# Patient Record
Sex: Female | Born: 1970 | Race: White | Hispanic: No | Marital: Married | Smoking: Never smoker
Health system: Southern US, Community
[De-identification: ages and names within clinical notes are randomized; demographics above are authoritative.]

## PROBLEM LIST (undated history)

## (undated) DIAGNOSIS — F419 Anxiety disorder, unspecified: Secondary | ICD-10-CM

## (undated) DIAGNOSIS — Z862 Personal history of diseases of the blood and blood-forming organs and certain disorders involving the immune mechanism: Secondary | ICD-10-CM

## (undated) DIAGNOSIS — G43909 Migraine, unspecified, not intractable, without status migrainosus: Secondary | ICD-10-CM

## (undated) DIAGNOSIS — F338 Other recurrent depressive disorders: Secondary | ICD-10-CM

## (undated) DIAGNOSIS — R760 Raised antibody titer: Secondary | ICD-10-CM

## (undated) DIAGNOSIS — D649 Anemia, unspecified: Secondary | ICD-10-CM

## (undated) HISTORY — DX: Raised antibody titer: R76.0

## (undated) HISTORY — PX: WISDOM TOOTH EXTRACTION: SHX21

## (undated) HISTORY — DX: Other recurrent depressive disorders: F33.8

## (undated) HISTORY — DX: Personal history of diseases of the blood and blood-forming organs and certain disorders involving the immune mechanism: Z86.2

## (undated) HISTORY — PX: TONSILLECTOMY: SUR1361

## (undated) HISTORY — DX: Anxiety disorder, unspecified: F41.9

## (undated) HISTORY — DX: Anemia, unspecified: D64.9

## (undated) HISTORY — DX: Migraine, unspecified, not intractable, without status migrainosus: G43.909

---

## 1998-08-10 ENCOUNTER — Other Ambulatory Visit: Admission: RE | Admit: 1998-08-10 | Discharge: 1998-08-10 | Payer: Self-pay | Admitting: Obstetrics and Gynecology

## 1999-01-16 ENCOUNTER — Other Ambulatory Visit: Admission: RE | Admit: 1999-01-16 | Discharge: 1999-01-16 | Payer: Self-pay | Admitting: Obstetrics and Gynecology

## 1999-11-20 ENCOUNTER — Encounter (INDEPENDENT_AMBULATORY_CARE_PROVIDER_SITE_OTHER): Payer: Self-pay

## 1999-11-20 ENCOUNTER — Inpatient Hospital Stay (HOSPITAL_COMMUNITY): Admission: AD | Admit: 1999-11-20 | Discharge: 1999-11-23 | Payer: Self-pay | Admitting: Obstetrics and Gynecology

## 1999-11-24 ENCOUNTER — Encounter: Admission: RE | Admit: 1999-11-24 | Discharge: 1999-12-25 | Payer: Self-pay | Admitting: Obstetrics and Gynecology

## 1999-12-24 ENCOUNTER — Other Ambulatory Visit: Admission: RE | Admit: 1999-12-24 | Discharge: 1999-12-24 | Payer: Self-pay | Admitting: Obstetrics and Gynecology

## 2001-01-08 ENCOUNTER — Other Ambulatory Visit: Admission: RE | Admit: 2001-01-08 | Discharge: 2001-01-08 | Payer: Self-pay | Admitting: Obstetrics and Gynecology

## 2002-03-01 ENCOUNTER — Other Ambulatory Visit: Admission: RE | Admit: 2002-03-01 | Discharge: 2002-03-01 | Payer: Self-pay | Admitting: Obstetrics and Gynecology

## 2003-03-23 ENCOUNTER — Other Ambulatory Visit: Admission: RE | Admit: 2003-03-23 | Discharge: 2003-03-23 | Payer: Self-pay | Admitting: Obstetrics and Gynecology

## 2003-04-04 ENCOUNTER — Encounter: Payer: Self-pay | Admitting: Obstetrics and Gynecology

## 2003-04-04 ENCOUNTER — Encounter: Admission: RE | Admit: 2003-04-04 | Discharge: 2003-04-04 | Payer: Self-pay | Admitting: Obstetrics and Gynecology

## 2003-04-22 ENCOUNTER — Emergency Department (HOSPITAL_COMMUNITY): Admission: EM | Admit: 2003-04-22 | Discharge: 2003-04-22 | Payer: Self-pay

## 2003-11-25 ENCOUNTER — Emergency Department (HOSPITAL_COMMUNITY): Admission: EM | Admit: 2003-11-25 | Discharge: 2003-11-25 | Payer: Self-pay | Admitting: Emergency Medicine

## 2003-12-22 ENCOUNTER — Other Ambulatory Visit: Admission: RE | Admit: 2003-12-22 | Discharge: 2003-12-22 | Payer: Self-pay | Admitting: Obstetrics and Gynecology

## 2004-06-15 ENCOUNTER — Ambulatory Visit (HOSPITAL_COMMUNITY): Admission: RE | Admit: 2004-06-15 | Discharge: 2004-06-15 | Payer: Self-pay | Admitting: Obstetrics and Gynecology

## 2004-06-27 ENCOUNTER — Inpatient Hospital Stay (HOSPITAL_COMMUNITY): Admission: RE | Admit: 2004-06-27 | Discharge: 2004-06-30 | Payer: Self-pay | Admitting: Obstetrics and Gynecology

## 2004-06-27 ENCOUNTER — Encounter (INDEPENDENT_AMBULATORY_CARE_PROVIDER_SITE_OTHER): Payer: Self-pay | Admitting: Specialist

## 2004-07-02 ENCOUNTER — Encounter: Admission: RE | Admit: 2004-07-02 | Discharge: 2004-08-01 | Payer: Self-pay | Admitting: Obstetrics and Gynecology

## 2004-08-07 ENCOUNTER — Other Ambulatory Visit: Admission: RE | Admit: 2004-08-07 | Discharge: 2004-08-07 | Payer: Self-pay | Admitting: Obstetrics and Gynecology

## 2009-04-04 ENCOUNTER — Other Ambulatory Visit: Admission: RE | Admit: 2009-04-04 | Discharge: 2009-04-04 | Payer: Self-pay | Admitting: Obstetrics and Gynecology

## 2010-09-30 ENCOUNTER — Encounter: Payer: Self-pay | Admitting: Neurology

## 2011-01-25 NOTE — Op Note (Signed)
NAME:  Karen Lozano, Karen Lozano                   ACCOUNT NO.:  [id number]   MEDICAL RECORD NO.:  [id number]          PATIENT TYPE:  INP   LOCATION:  9198                          FACILITY:  [facility]   PHYSICIAN:  Juluis Mire, M.D.   DATE OF BIRTH:  19-Feb-1971   DATE OF PROCEDURE:  06/27/2004  DATE OF DISCHARGE:                                 OPERATIVE REPORT   PREOPERATIVE DIAGNOSIS:  1.  Intrauterine pregnancy at 38 weeks with prior cesarean section; desires      repeat.  2.  Prior pregnancy was complicated by the baby having an intrauterine      cerebrovascular accident.   POSTOPERATIVE DIAGNOSIS:  1.  Intrauterine pregnancy at 38 weeks with prior cesarean section; desires      repeat.  2.  Prior pregnancy was complicated by the baby having an intrauterine      cerebrovascular accident.   PROCEDURE:  Low transverse cesarean section.   SURGEON:  Juluis Mire, M.D.   ANESTHESIA:  Spinal.   ESTIMATED BLOOD LOSS:  500 mL.   PACKS AND DRAINS:  None.   INTRAOPERATIVE BLOOD REPLACED:  None.   COMPLICATIONS:  None.   INDICATIONS:  As dictated in the history and physical.   DESCRIPTION OF PROCEDURE:  The patient was taken to the operating room and  placed in the supine position with a left lateral tilt.  After a  satisfactory level of spinal anesthesia, the abdomen was prepped out with  Betadine and draped in a sterile field.  A lower transverse skin incision  was identified and excised.  Incision was extended through the subcutaneous  tissue.  The fascia was entered sharply, incision to the fascia was extended  laterally.  The fascia was taken off the muscle superiorly and inferiorly.  The rectus muscles were separated in the midline.  The peritoneum was  entered sharply and incision in the peritoneum extended both superiorly and  inferiorly.  A low transverse bladder flap was developed.  A low transverse  uterine incision was begun with the knife and extended laterally using  manual  traction.  Amniotic fluid was clear.  The infant was presenting in  the vertex presentation and delivered with elevation of the head and fundal  pressure.  The infant was a viable female, weighing 5 pounds 14 ounces.  Apgars were 9/9.  The pH is pending.  The placenta was delivered manually  and sent for pathology.  The uterus was exteriorized.  The uterus was closed  in a running locking stitch using 0 chromic using two layer closure  technique.  We had excellent hemostasis.  Tubes and ovaries were  unremarkable.  Urine output was clear and adequate.  The uterus was returned  to the abdominal cavity.  We thoroughly irrigated it.  Hemostasis was  excellent.  Muscles were reapproximated with running suture of 3-0 Vicryl.  The fascia was closed was running sutures of 0 PDS.  The skin was closed  with staples and Steri-Strips.  Sponge, instrument and needle counts were  reported as  correct by the  circulating nurse x2.  Foley catheter remained clear at the  time of closure.  The patient tolerated the procedure well and was returned  to the recovery room in good condition.   DESCRIPTION OF PROCEDURE:  @@      JSM/MEDQ  D:  06/27/2004  T:  06/27/2004  Job:  [id number]

## 2011-01-25 NOTE — Discharge Summary (Signed)
[facility]  Patient:    Karen Lozano, Karen Lozano                            MRN: [id number] Adm. Date:  [id number] Disc. Date: [id number] Attending:  Donne Hazel Dictator:   Danie Chandler, R.N.                           Discharge Summary  ADMISSION DIAGNOSIS:          Intrauterine pregnancy at 36-4/[redacted] weeks gestation.  Admitted for induction of labor for fetal growth restriction and grade 3 placenta at term.  DISCHARGE DIAGNOSES:          1. Intrauterine pregnancy at 36-4/[redacted] weeks gestation.                                  Admitted for induction of labor for fetal growth                                  restriction and grade 3 placenta at term.                               2. Fetal intolerance to labor.                               3. Anemia.  PROCEDURES:                   On November 20, 1999, primary low transverse cesarean section.  HISTORY OF PRESENT ILLNESS:   The patient is a 40 year old, gravida 2, para 0, t 36-4/[redacted] weeks gestation, who was admitted for induction for an estimated fetal weight of 5% and grade 3 placenta.  The patient has an estimated date of confinement of November 29, 1999.  The patient has been monitored for lagging fetal growth.  The patient was admitted.  Her PIH labs were checked, which were within normal limits.  The patient did receive Cytotec and Pitocin for induction of labor and she did progress throughout the day.  That evening, there were repetitive late deceleration in the fetal heart rate tracing.  There was one acceleration with scalp stimulation, but then variables followed by slow return.  Other variables  with slow returns also.  The cervix remained tight at 4 cm, 90%, vertex, and -1. It was elected to proceed with primary cesarean section for fetal intolerance to labor.  HOSPITAL COURSE:              The patient is taken to the operating room and undergoes the above-named procedure without complications.   This is productive f a viable female infant with Apgars of 8 at one minute and 9 at five minutes and an arterial cord pH of 7.29.  Postoperatively the patient does well.  On postoperative day #1, the patient had good control of pain.  Her hemoglobin was 8.4, hematocrit 24.9, and white blood cell count 13.9.  She was started on iron daily and a repeat CBC ordered for November 22, 1999.  On postoperative day #2, the patients hemoglobin was stable at 8.2.  She was continued on iron daily.  She had a good return of bowel function and was tolerating a regular diet.  She was also ambulating well without difficulty.  She was discharged home on postoperative day #3.  CONDITION ON DISCHARGE:       Good.  DIET:                         Regular as tolerated.  ACTIVITY:                     No heavy lifting.  No driving.  No vaginal entry.  FOLLOW-UP:                    She is to follow up in the office in one to two weeks for incision check.  She is to call for temperature greater than 100 degrees, persistent nausea or vomiting, heavy vaginal bleeding, and/or redness or drainage from the incision site.  DISCHARGE MEDICATIONS:        1. Prenatal vitamins one p.o. q.d.                               2. Nu-Iron 150 mg one p.o. q.d.                               3. Tylox one to two every four hours as needed for                                  pain.                               4. Ibuprofen 600 mg p.o. q.6h. p.r.n. pain. DD:  11/23/99 TD:  11/25/99 Job: [id number]

## 2011-01-25 NOTE — Discharge Summary (Signed)
NAME:  Karen Lozano, Karen Lozano                   ACCOUNT NO.:  [id number]   MEDICAL RECORD NO.:  [id number]          PATIENT TYPE:  INP   LOCATION:  9114                          FACILITY:  [facility]   PHYSICIAN:  Freddy Finner, M.D.   DATE OF BIRTH:  March 11, 1971   DATE OF ADMISSION:  06/27/2004  DATE OF DISCHARGE:  06/30/2004                                 DISCHARGE SUMMARY   ADMISSION DIAGNOSES:  1.  Intrauterine pregnancy at 60 weeks estimated gestational age.  2.  Prior cesarean delivery, desires repeat.   DISCHARGE DIAGNOSES:  1.  Status post low transverse cesarean section.  2.  Viable female infant.   PROCEDURE:  Repeat low transverse cesarean section.   REASON FOR ADMISSION:  Please see dictated H&P.   HOSPITAL COURSE:  The patient is a 40 year old, gravida 3, para 1 that was  admitted to [facility] for a scheduled cesarean section.  The patient had had a previous cesarean delivery and desired repeat.  Pregnancy had been complicated by a prior pregnancy with intrauterine growth  retardation and infant with an intrauterine stroke.  On the morning of  admission, the patient was taken to the operating room where spinal  anesthesia was administered without difficulty.  A low transverse incision  was made with the delivery of a viable female infant weighing 5 pounds 14  ounces with Apgar's of 9 at 1 minute and 9 at 5 minutes. Arterial cord pH  was 7.35.  The patient tolerated the procedure well and was taken to the  recovery room in stable condition.   On postoperative day one, the patient was without complaints, vital signs  were stable, she was afebrile, abdomen was soft with good return of bowel  function, fundus was firm and nontender, abdominal dressing was noted to  have a small amount of drainage noted on bandage. The patient was ambulating  well.  Labs revealed a hemoglobin of 8.5, platelet count of 158,000, WBC  count of 11.2.  The baby was doing well, however, the  baby was noted to have  some tachypnea but remained in the central nursery. On postoperative day  two, the patient did complain of some neck stiffness, she denied a headache  or blurred vision. Vital signs were stable, she was afebrile, abdomen was  soft, fundus was firm and nontender.  Abdominal dressing had been removed  revealing an incision that was clean, dry and intact.  On postoperative day  three, the patient was without any major complaints other than some  incisional soreness, vital signs remained stable. She was afebrile, fundus  was firm and nontender. Incision was clean, dry and intact, staples removed  and patient was discharged home.   CONDITION ON DISCHARGE:  Good.   DIET:  Regular as tolerated.   ACTIVITY:  No heavy lifting, no driving x2 weeks, no vaginal entry.   FOLLOW UP:  The patient is to followup in the office in 7-10 days for an  incision check. She is to call for a temperature greater than 100 degrees,  persistent nausea and vomiting,  heavy vaginal bleeding and/or redness or  drainage from the incisional site.   DISCHARGE MEDICATIONS:  1.  Tylox #30, 1 p.o. q.4-6 h. p.r.n.  2.  Motrin 600 mg q.6 h. p.r.n.  3.  Prenatal vitamins 1 p.o. daily.  4.  Ferrous sulfate 325 mg 1 p.o. b.i.d. with meals.     Caro   CC/MEDQ  D:  07/16/2004  T:  07/16/2004  Job:  [id number]

## 2011-01-25 NOTE — H&P (Signed)
NAME:  Karen Lozano, FRALEY NO.:  [id number]   MEDICAL RECORD NO.:  [id number]          PATIENT TYPE:  INP   LOCATION:  NA                            FACILITY:  [facility]   PHYSICIAN:  Juluis Mire, M.D.   DATE OF BIRTH:  [birth year]   DATE OF ADMISSION:  06/27/2004  DATE OF DISCHARGE:                                HISTORY & PHYSICAL   HISTORY OF PRESENT ILLNESS:  The patient is a 40 year old gravida 3, para 1,  abortus 1, married white female, last menstrual period of January 26, giving  her an estimated date of confinement of November 3, and an estimated  gestational age of [redacted] weeks. This is consistent with initial exam and  ultrasound. She presents for repeat cesarean section.   Her prior pregnancy was complicated by intrauterine growth retardation. She  did undergo a cesarean section. The infant did suffer an intrauterine  stroke. She had a complete workup for thrombophilia with negative findings.  We have repeated her lupus anticoagulant, anticardiolipin and antinuclear  antibody which were all negative early in pregnancy. She has been maintained  on baby aspirin. She has been followed with serial ultrasounds and nonstress  testing. There has not been any evidence of growth retardation or decrease  in amniotic fluid, was seen to have normal placental function. She did have  an amniocentesis done late week with an LS of 1.8:1 with no PG. We have  waited a week and now will proceed with repeat cesarean section. Her glucose  test was normal and she is negative I believe for group B strep.   ALLERGIES:  No known drug allergies.   MEDICATIONS:  Prenatal vitamins and baby aspirin.   PAST MEDICAL HISTORY, FAMILY HISTORY AND SOCIAL HISTORY:  Please see  prenatal records.   REVIEW OF SYSTEMS:  Noncontributory.   PHYSICAL EXAMINATION:  VITAL SIGNS:  The patient is afebrile with stable  vital signs.  HEENT EXAM:  The patient normocephalic. Pupils equal, round and reactive  to  light and accommodation. Extraocular movements were intact. Sclerae and  conjunctivae clear. Oropharynx clear.  NECK:  Without thyromegaly.  BREASTS:  No discrete masses.  LUNGS:  Clear.  CARDIAC SYSTEM:  Regular rhythm and rate with a grade 2/6 systolic ejection  murmur, no clicks or gallops.  ABDOMINAL EXAM:  Gravid uterus consistent with dates.  PELVIC EXAM:  Deferred.  EXTREMITIES:  Trace edema.  NEUROLOGICAL EXAM:  Grossly within normal limits. Deep tendon reflexes 2+.  No clonus.   IMPRESSION:  1.  Intrauterine pregnancy at 38 weeks with prior cesarean section, desires      repeat.  2.  Prior pregnancy complicated by intrauterine growth retardation, infant      with intrauterine stroke.   PLAN:  The patient will undergo repeat cesarean section. The options of a  trial of labor have been discussed and declined by the patient. Risks of  procedure discussed including the risk of infection; the risk of hemorrhage  that  could require transfusion, the risk of HIV or hepatitis; the risk of injury  to adjacent organs including bladder, bowel or ureters that could require  further exploratory surgery; the risk of deep venous thrombosis and  pulmonary embolus. The patient voiced understanding of the indications and  risks.      JSM/MEDQ  D:  06/27/2004  T:  06/27/2004  Job:  [id number]

## 2011-01-25 NOTE — Op Note (Signed)
[facility]  Patient:    Karen Lozano, Karen Lozano                            MRN: [id number] Proc. Date: 11/20/99 Adm. Date:  [id number] Attending:  Donne Hazel                           Operative Report  PREOPERATIVE DIAGNOSIS:       Fetal intolerance to labor.  POSTOPERATIVE DIAGNOSIS:      Fetal intolerance to labor.  OPERATION:                    Primary low transverse cesarean section.  SURGEON:                      Beather Arbour. Thomasena Edis, M.D.  ASSISTANT:  ANESTHESIA:                   Epidural.  ESTIMATED BLOOD LOSS:         750 cc.  URINE OUTPUT:                 250 cc.  FLUIDS:                       1600 cc of Crystalloid and 1 gram of Ancef IV.  DRAINS:                       Foley.  COMPLICATIONS:                None.  DESCRIPTION OF PROCEDURE:     The patient was brought to the operating room and  identified on the operating table.  After topping off the patients epidural, she was placed in the left uterine displacement position and prepped and draped in he usual sterile fashion.  A Foley catheter had been previously placed.  A Pfannenstiel incision was made and carried down to the fascia.  The fascia was scored in the midline and extended bilaterally using Mayo scissors.  It was then separated free from the underlying muscles.  The muscles were separated in the midline down to the symphysis.  The peritoneum was entered sharply and carefully taking care to avoid bowel or other abdominal contents.  The peritoneal incision was extended superiorly and then inferiorly down to the bladder edge.  The bladder blade was placed and the bladder flap was developed.  The uterus was scored in he lower uterine segment and the incision was extended in a curvilinear fashion using bandage scissors.  There was noted to be clear fluid.  The baby was noted to be  well placed into the pelvis and at the last two cervical checks was noted to  be  extremely molded.  The vertex was delivered without difficulty onto the operative field.  The infant was bulb suctioned and the remainder of the infant was delivered without difficulty onto the operative field.  The cord was doubly clamped and cut and the baby was handed to the awaiting neonatal resuscitation team.  The cord H was sent and found to be 7.29.  Cord bloods were collected.  The placenta was manually removed and sent to pathology for examination due to the history of grade III placenta and intrauterine growth restriction.  The uterus was wiped clean of products of conception and  exteriorized onto the abdomen.  It was wrapped in a et lap pack.  Ring clamps were placed on the uterine incision.  There was noted to be a small vertical incision approximately 2 cm which was repaired using a simple running interlocking suture of 0 Monocryl.  The remainder of the uterine incision was repaired using a simple running interlocking suture of 0 Monocryl.  The figure-of-eight sutures of 0 Monocryl were placed at bleeding edges along the uterine incision.  After noting excellent uterine incision hemostasis and excellent hemostasis at the extension, the uterus was placed back into the abdominal cavity after noting the uterus, tubes, and ovaries to be grossly normal.  The incision was irrigated copiously with warm Ringers lactate and the gutters were irrigated copiously with warm Ringers lactate as well.  Again the uterine incision was examined for evidence of hemostasis and excellent hemostasis was noted.  There as noted to be excellent hemostasis at the bladder flap.  Kelly clamps were placed on the peritoneum and the peritoneum was closed using simple running suture of 2-0  Monocryl.  There was noted to be a torsed appendices epiploica which was clamped, cut, sent to pathology for examination, and the area was tied with a free tie.  There was noted to be excellent  hemostasis.  After closure of the peritoneum using a simple running suture of 2-0 Monocryl, the muscles were tacked back together n the midline with one suture of 2-0 Monocryl.  Excellent hemostasis was achieved  over the subfascial areas and muscle using cautery.  The subfascial areas were irrigated copiously with warm Ringers lactate and again excellent hemostasis was assured.  The fascia was closed using a simple running suture of 0 PDS with the  suture anchored at the leftmost angle of the incision, run to the rightmost angle of the incision, and tied.  The subcutaneous tissue was irrigated copiously with warm Ringers lactate, and excellent hemostasis was noted.  The skin was closed with staples and Steri-Strips were applied.  The patient tolerated the procedure well without apparent complications and was transferred to the recovery room in  stable condition after all sponge, needle, and instrument counts were correct. The baby was found to be a 5 pound 5 ounce female with Apgars of 8 and 9.  Cord pH 7.29 and went to the normal newborn nursery. DD:  11/20/99 TD:  11/21/99 Job: [id number]

## 2012-03-23 ENCOUNTER — Other Ambulatory Visit: Payer: Self-pay | Admitting: Family Medicine

## 2012-03-23 DIAGNOSIS — Z1231 Encounter for screening mammogram for malignant neoplasm of breast: Secondary | ICD-10-CM

## 2012-04-08 ENCOUNTER — Ambulatory Visit: Payer: Self-pay

## 2013-11-16 ENCOUNTER — Other Ambulatory Visit: Payer: Self-pay

## 2013-11-16 DIAGNOSIS — Z1231 Encounter for screening mammogram for malignant neoplasm of breast: Secondary | ICD-10-CM

## 2013-11-19 ENCOUNTER — Ambulatory Visit
Admission: RE | Admit: 2013-11-19 | Discharge: 2013-11-19 | Disposition: A | Discharge status: Home / Self Care | Payer: 59 | Source: Ambulatory Visit

## 2013-11-19 DIAGNOSIS — Z1231 Encounter for screening mammogram for malignant neoplasm of breast: Secondary | ICD-10-CM

## 2015-03-16 ENCOUNTER — Ambulatory Visit (INDEPENDENT_AMBULATORY_CARE_PROVIDER_SITE_OTHER): Payer: 59 | Admitting: Obstetrics & Gynecology

## 2015-03-16 ENCOUNTER — Other Ambulatory Visit: Payer: Self-pay | Admitting: Obstetrics & Gynecology

## 2015-03-16 ENCOUNTER — Encounter: Payer: Self-pay | Admitting: Obstetrics & Gynecology

## 2015-03-16 ENCOUNTER — Ambulatory Visit
Admission: RE | Admit: 2015-03-16 | Discharge: 2015-03-16 | Disposition: A | Discharge status: Home / Self Care | Payer: 59 | Source: Ambulatory Visit | Attending: Obstetrics & Gynecology | Admitting: Obstetrics & Gynecology

## 2015-03-16 VITALS — BP 124/60 | HR 64 | Resp 16 | Ht 63.0 in | Wt 114.8 lb

## 2015-03-16 DIAGNOSIS — Z1231 Encounter for screening mammogram for malignant neoplasm of breast: Secondary | ICD-10-CM

## 2015-03-16 DIAGNOSIS — N943 Premenstrual tension syndrome: Secondary | ICD-10-CM

## 2015-03-16 DIAGNOSIS — Z01419 Encounter for gynecological examination (general) (routine) without abnormal findings: Secondary | ICD-10-CM | POA: Diagnosis not present

## 2015-03-16 DIAGNOSIS — Z124 Encounter for screening for malignant neoplasm of cervix: Secondary | ICD-10-CM | POA: Diagnosis not present

## 2015-03-16 DIAGNOSIS — Z0000 Encounter for general adult medical examination without abnormal findings: Secondary | ICD-10-CM | POA: Diagnosis not present

## 2015-03-16 DIAGNOSIS — R760 Raised antibody titer: Secondary | ICD-10-CM

## 2015-03-16 NOTE — Progress Notes (Signed)
44 y.o. [id number] MarriedCaucasianF here for annual exam.    Has tried Effexor and Welbutrin.  She felt this really "killed" her libido.  She has been given Ativan but can't take it due to being on call.    Patient's last menstrual period was 02/27/2015.          Sexually active: Yes.    The current method of family planning is vasectomy.    Exercising: Yes.    walking, infrequent weight training Smoker:  no  Health Maintenance: Pap:  2013 History of abnormal Pap:  no MMG:  11/19/13-normal Colonoscopy:  none BMD:   Yes-not sure when TDaP:  UTD Screening Labs: PCP, Hb today: PCP, Urine today: PCP   reports that she has never smoked. She has never used smokeless tobacco. She reports that she drinks alcohol. She reports that she does not use illicit drugs.  Past Medical History  Diagnosis Date  . 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 Migraine     had previous MRI/MRA-diag with ocular migraines  . Anemia     during pregnancy  . Anxiety     Past Surgical History  Procedure Laterality Date  . Cesarean section      x2  . Tonsillectomy    . Wisdom tooth extraction      Current Outpatient Prescriptions  Medication Sig Dispense Refill  . ADDERALL XR 15 MG 24 hr capsule Take by mouth daily.  0   No current facility-administered medications for this visit.    Family History  Problem Relation Age of Onset  . Endometriosis Mother     hysterectomy age 56  . Lymphoma Mother   . Diabetes Maternal Grandmother   . Colon cancer Maternal Grandmother   . Hypertension Maternal Grandmother   . Skin cancer Father   . Stroke Daughter     in utero-cerebal palsy    ROS:  Pertinent items are noted in HPI.  Otherwise, a comprehensive ROS was negative.  Exam:   BP 124/60 mmHg  Pulse 64  Resp 16  Ht 5\' 3"  (1.6 m)  Wt 114 lb 12.8 oz (52.073 kg)  BMI 20.34 kg/m2  LMP 02/27/2015  Weight change: @WEIGHTCHANGE @ Height:    Height: 5\' 3"  (160 cm)  Ht Readings from Last 3 Encounters:  03/16/15 5\' 3"  (1.6 m)    General appearance: alert, cooperative and appears stated age Head: Normocephalic, without obvious abnormality, atraumatic Neck: no adenopathy, supple, symmetrical, trachea midline and thyroid normal to inspection and palpation Lungs: clear to auscultation bilaterally Breasts: normal appearance, no masses or tenderness Heart: regular rate and rhythm Abdomen: soft, non-tender; bowel sounds normal; no masses,  no organomegaly Extremities: extremities normal, atraumatic, no cyanosis or edema Skin: Skin color, texture, turgor normal. No rashes or lesions Lymph nodes: Cervical, supraclavicular, and axillary nodes normal. No abnormal inguinal nodes palpated Neurologic: Grossly normal   Pelvic: External genitalia:  no lesions              Urethra:  normal appearing urethra with no masses, tenderness or lesions              Bartholins and Skenes: normal                 Vagina: normal appearing vagina with normal color and discharge, no lesions              Cervix: no lesions  Pap taken: Yes.   Bimanual Exam:  Uterus:  normal size, contour, position, consistency, mobility, non-tender              Adnexa: normal adnexa and no mass, fullness, tenderness               Rectovaginal: Confirms               Anus:  normal sphincter tone, no lesions  Chaperone was present for exam.  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 Beryle Beams but follow up testing was negative) Ocular migraines Family hx of colon cancer in [place], diagnosed in her 44's  P:   Mammogram overdue.  Scheduled for pt today.  03/16/15 at 3:40pm. Pap smear with neg HR HPV. Return for Day 3 FSH, estradiol, progesterone, testosterone levels.  Orders placed.  She will call when cycles starts (may not be this month due  to beach trip).  Pt and I discussed low dosed cyclical Prozac vs progesterone only OCPs or cream.  Will wait for lab results.  Orders placed. Recommend screening colonoscopy age 58 return annually or prn

## 2015-03-17 ENCOUNTER — Telehealth: Payer: Self-pay | Admitting: Obstetrics & Gynecology

## 2015-03-17 NOTE — Telephone Encounter (Signed)
Patient returned call. She will be on cycle day 3 tomorrow. Requests to have labs drawn tomorrow at [facility] hospital. Requested patient come to office to pick up written prescription for labs, however, due to time of day, patient unable to come to office and daughter is in tutoring. Patient requests that order be faxed to [facility] PACU because she works there and they will put prescription in her locker for her pick up tomorrow. Patient does not mind that the prescription be sent to her work fax, she is advised that it does list the labs to be drawn, her name, address, age, dob and diagnosis, again patient expresses authorization that this be sent via fax to PACU. She called the PACU while I was on the line and confirmed fax number to be [phone]. Okay per RN Supervisor Lamont Snowball, RN to fax per patient request to be able to have labs drawn on day 3 of cycle when our office is not open.  Lab order also faxed to [facility] hospital lab at [phone].  Fax confirmation received to both. Confirmed with Sanita at lab that faxed order was received  Return call to patient to advise that I have sent faxes to both locations and that fax confirmation was received to both. Patient will have labs drawn tomorrow.   Routing to provider for final review. Patient agreeable to disposition. Will close encounter.

## 2015-03-17 NOTE — Telephone Encounter (Signed)
Message left to return call to Sanika Brosious at [phone].    

## 2015-03-17 NOTE — Telephone Encounter (Signed)
Patient was told to call to have a prescription written for labs to be done at [facility] tomorrow 03/18/15. Last seen 03/16/15.

## 2015-03-20 LAB — IPS PAP TEST WITH HPV

## 2015-04-10 ENCOUNTER — Telehealth: Payer: Self-pay | Admitting: Obstetrics & Gynecology

## 2015-04-10 NOTE — Telephone Encounter (Signed)
Patient is calling for results from 03/18/2015 lab draw at [place] I will have Dr.Miller review and advised results and return call. Patient is agreeable.

## 2015-04-10 NOTE — Telephone Encounter (Signed)
Patient calling for results.

## 2015-04-11 MED ORDER — NORETHINDRONE 0.35 MG PO TABS: 1.0000 | ORAL_TABLET | Freq: Every day | ORAL | Status: DC

## 2015-04-11 NOTE — Telephone Encounter (Signed)
Spoke with pt regarding results.   FSH and estradiol do show perimenopausal status.  H/o antiphospholipid antibody syndrome.  We discussed at last visit Micronor vs cyclic or continuous SSRI usage.  She would like to try micronor.  Rx to pharmacy.  Pt will let me know over next month or two if this is helping.  All questions answered.  Ok to close encounter.

## 2015-04-26 ENCOUNTER — Encounter: Payer: Self-pay | Admitting: Obstetrics & Gynecology

## 2016-01-19 DIAGNOSIS — G43909 Migraine, unspecified, not intractable, without status migrainosus: Secondary | ICD-10-CM | POA: Insufficient documentation

## 2016-04-10 ENCOUNTER — Ambulatory Visit: Payer: 59 | Admitting: Podiatry

## 2016-04-18 ENCOUNTER — Ambulatory Visit (INDEPENDENT_AMBULATORY_CARE_PROVIDER_SITE_OTHER): Payer: 59 | Admitting: Podiatry

## 2016-04-18 ENCOUNTER — Encounter: Payer: Self-pay | Admitting: Podiatry

## 2016-04-18 VITALS — BP 109/72 | HR 68 | Resp 12

## 2016-04-18 DIAGNOSIS — L989 Disorder of the skin and subcutaneous tissue, unspecified: Secondary | ICD-10-CM | POA: Diagnosis not present

## 2016-04-18 DIAGNOSIS — B079 Viral wart, unspecified: Secondary | ICD-10-CM

## 2016-04-18 DIAGNOSIS — B070 Plantar wart: Secondary | ICD-10-CM

## 2016-04-18 DIAGNOSIS — B078 Other viral warts: Secondary | ICD-10-CM

## 2016-04-18 NOTE — Progress Notes (Addendum)
   Subjective:    Patient ID: Karen Lozano, female    DOB: Dec 07, 1970, 45 y.o.   MRN: [id number]  HPI this patient presents to the office stating that she has had a skin lesion on the bottom of her right big toe for over 5 months. She states that she has worn pads and even used acid in an effort to get rid of her skin lesion which she believes is a wart. She says this started out as a black area and has grown. She says that this area is now painful walking and wearing her shoes. She presents the office today for definitive evaluation and treatment of this condition    Review of Systems  All other systems reviewed and are negative.      Objective:   Physical Exam GENERAL APPEARANCE: Alert, conversant. Appropriately groomed. No acute distress.  VASCULAR: Pedal pulses are  palpable at  [facility] and PT bilateral.  Capillary refill time is immediate to all digits,  Normal temperature gradient.  Digital hair growth is present bilateral  NEUROLOGIC: sensation is normal to 5.07 monofilament at 5/5 sites bilateral.  Light touch is intact bilateral, Muscle strength normal.  MUSCULOSKELETAL: acceptable muscle strength, tone and stability bilateral.  Intrinsic muscluature intact bilateral.  HAV  B/L.   DERMATOLOGIC: skin color, texture, and turgor are within normal limits.  No preulcerative lesions or ulcers  are seen, no interdigital maceration noted.  No open lesions present.  Digital nails are asymptomatic. No drainage noted. White circular lesion on plantar aspect of right big toe.  White cauliflower appearing tissue noted right hallux.         Assessment & Plan:  Benign skin lesion  Verruca  IE  Excision of wart.  This patient was anesthetized with 2% lidocaine in right hallux toe block. The surgical site was then washed with betadine and alcohol.  Using a punch the lesion was excised and passed off as specimen.  The surgical site was cauterized with phenol and washed with alcohol.  The site was bandaged  with neosporin, sterile 2x2 and kling.  Home instructions given. RTC 1 week.   Gardiner Barefoot DPM

## 2016-04-25 ENCOUNTER — Ambulatory Visit (INDEPENDENT_AMBULATORY_CARE_PROVIDER_SITE_OTHER): Payer: 59 | Admitting: Podiatry

## 2016-04-25 VITALS — BP 103/58 | HR 69 | Resp 12

## 2016-04-25 DIAGNOSIS — B079 Viral wart, unspecified: Secondary | ICD-10-CM

## 2016-04-25 DIAGNOSIS — B078 Other viral warts: Secondary | ICD-10-CM

## 2016-04-25 DIAGNOSIS — L989 Disorder of the skin and subcutaneous tissue, unspecified: Secondary | ICD-10-CM

## 2016-04-25 NOTE — Progress Notes (Signed)
This patient returns to the office 1 week after excision of the skin lesion from the bottom of her right big toe. This lesion was sent off to [facility].  She says she has been soaking her foot and bandaging her foot as directed. She is not experiencing any pain or discomfort at the surgical site  GENERAL APPEARANCE: Alert, conversant. Appropriately groomed. No acute distress.  VASCULAR: Pedal pulses are  palpable at  [facility] and PT bilateral.  Capillary refill time is immediate to all digits,  Normal temperature gradient.  Digital hair growth is present bilateral  NEUROLOGIC: sensation is normal to 5.07 monofilament at 5/5 sites bilateral.  Light touch is intact bilateral, Muscle strength normal.  MUSCULOSKELETAL: acceptable muscle strength, tone and stability bilateral.  Intrinsic muscluature intact bilateral.  Rectus appearance of foot and digits noted bilateral.   DERMATOLOGIC: skin color, texture, and turgor are within normal limits.  No preulcerative lesions or ulcers  are seen, no interdigital maceration noted.  No open lesions present.  Digital nails are asymptomatic. No drainage noted. Normal healing with no drainage at surgical site.   Dx>  Skin lesion right hallux  TX.  ROV  examination of the surgical site reveals healing noted. No evidence of any redness, swelling or infection. No palpable pain noted. Return to the office when necessary   Gardiner Barefoot DPM

## 2016-04-26 ENCOUNTER — Encounter: Payer: Self-pay | Admitting: Podiatry

## 2017-02-14 ENCOUNTER — Encounter: Payer: Self-pay | Admitting: Obstetrics & Gynecology

## 2017-02-14 ENCOUNTER — Other Ambulatory Visit: Payer: Self-pay | Admitting: Obstetrics & Gynecology

## 2017-02-14 ENCOUNTER — Ambulatory Visit (INDEPENDENT_AMBULATORY_CARE_PROVIDER_SITE_OTHER): Payer: 59 | Admitting: Obstetrics & Gynecology

## 2017-02-14 VITALS — BP 90/60 | HR 72 | Resp 14 | Ht 63.25 in | Wt 116.0 lb

## 2017-02-14 DIAGNOSIS — Z01419 Encounter for gynecological examination (general) (routine) without abnormal findings: Secondary | ICD-10-CM

## 2017-02-14 DIAGNOSIS — Z0000 Encounter for general adult medical examination without abnormal findings: Secondary | ICD-10-CM

## 2017-02-14 MED ORDER — NORETHINDRONE 0.35 MG PO TABS: 1.0000 | ORAL_TABLET | Freq: Every day | ORAL | 4 refills | Status: DC

## 2017-02-14 NOTE — Progress Notes (Addendum)
46 y.o. [id number] MarriedCaucasianF here for annual exam.  Doing well.  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  Oldest daughter is transitioning from home schooling to public school ([employer]) for her junior and senior year.  This is adding stressors to her life.   PMS issues are about the same for her.    Patient's last menstrual period was 01/22/2017 (approximate).          Sexually active: Yes.    The current method of family planning is vasectomy.    Exercising: No.  The patient does not participate in regular exercise at present. Smoker:  no  Health Maintenance: Pap:  03/16/15 Neg. HR HPV:neg   04/04/09 Neg  History of abnormal Pap:  no MMG:  03/16/15 BIRADS1:neg. Pt knows she is due  Colonoscopy:  N/A BMD:   N/A TDaP:  2016  Pneumonia vaccine(s):  No Zostavax:   No Hep C testing: Done  Screening Labs: Here   reports that she has never smoked. She has never used smokeless tobacco. She reports that she drinks alcohol. She reports that she does not use drugs.  Past Medical History:  Diagnosis Date  . Anemia    during pregnancy  . 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 Anxiety   . Migraine    had previous MRI/MRA-diag with ocular migraines    Past Surgical History:  Procedure Laterality Date  . CESAREAN SECTION     x2  . TONSILLECTOMY    . WISDOM TOOTH EXTRACTION      No current outpatient prescriptions on file.   No current facility-administered medications for this visit.     Family History  Problem Relation Age of Onset  . Endometriosis Mother        hysterectomy age 30  . Lymphoma Mother   . Diabetes Maternal Grandmother   . Colon cancer Maternal Grandmother   . Hypertension Maternal Grandmother   . Skin cancer Father   . Stroke Daughter        in utero-cerebal palsy    ROS:  Pertinent items are noted in HPI.  Otherwise, a comprehensive ROS was negative.  Exam:   BP 90/60 (BP Location: Right Arm,  Patient Position: Sitting, Cuff Size: Normal)   Pulse 72   Resp 14   Ht 5' 3.25" (1.607 m)   Wt 116 lb (52.6 kg)   LMP 01/22/2017 (Approximate)   BMI 20.39 kg/m   Weight change: -2#  Height: 5' 3.25" (160.7 cm)  Ht Readings from Last 3 Encounters:  02/14/17 5' 3.25" (1.607 m)  03/16/15 5\' 3"  (1.6 m)    General appearance: alert, cooperative and appears stated age Head: Normocephalic, without obvious abnormality, atraumatic Neck: no adenopathy, supple, symmetrical, trachea midline and thyroid normal to inspection and palpation Lungs: clear to auscultation bilaterally Breasts: normal appearance, no masses or tenderness Heart: regular rate and rhythm Abdomen: soft, non-tender; bowel sounds normal; no masses,  no organomegaly Extremities: extremities normal, atraumatic, no cyanosis or edema Skin: Skin color, texture, turgor normal. No rashes or lesions Lymph nodes: Cervical, supraclavicular, and axillary nodes normal. No abnormal inguinal nodes palpated Neurologic: Grossly normal   Pelvic: External genitalia:  no lesions              Urethra:  normal appearing urethra with no masses, tenderness or lesions              Bartholins and Skenes: normal  Vagina: normal appearing vagina with normal color and discharge, no lesions              Cervix: no lesions              Pap taken: No. Bimanual Exam:  Uterus:  normal size, contour, position, consistency, mobility, non-tender              Adnexa: normal adnexa and no mass, fullness, tenderness               Rectovaginal: Confirms               Anus:  normal sphincter tone, no lesions  Chaperone was present for exam.  A:  Well Woman with normal exam PMDD-like symptoms H/o antiphospholipid antibodies and daughter with intrauterine stroke (did see Dr. Beryle Beams and follow-up testing was negative) Ocular migraines Family hx of colon cancer in [place], diagnosed in her 83's  P:   Mammogram guidelines reviewed.  Pt aware  this is overdue. pap smear and HR HPV is up to date New guidelines for screening colonoscopy reviewed Will try micronor this year.  Rx sent to pharmacy on file. CBC, CMP, lipids, TSh, Vit D obtained today Return annually or prn

## 2017-02-14 NOTE — Addendum Note (Signed)
Addended by: Hale Bogus on: 02/14/2017 11:36 AM   Modules accepted: Orders

## 2017-02-15 LAB — COMPREHENSIVE METABOLIC PANEL
ALBUMIN: 4.1 g/dL (ref 3.5–5.5)
ALT: 10 IU/L (ref 0–32)
AST: 19 IU/L (ref 0–40)
Albumin/Globulin Ratio: 1.7 (ref 1.2–2.2)
Alkaline Phosphatase: 36 IU/L — ABNORMAL LOW (ref 39–117)
BUN / CREAT RATIO: 13 (ref 9–23)
BUN: 11 mg/dL (ref 6–24)
Bilirubin Total: 0.5 mg/dL (ref 0.0–1.2)
CO2: 25 mmol/L (ref 18–29)
CREATININE: 0.85 mg/dL (ref 0.57–1.00)
Calcium: 9.7 mg/dL (ref 8.7–10.2)
Chloride: 101 mmol/L (ref 96–106)
GFR calc non Af Amer: 82 mL/min/{1.73_m2} (ref >59)
GFR, EST AFRICAN AMERICAN: 95 mL/min/{1.73_m2} (ref >59)
GLUCOSE: 96 mg/dL (ref 65–99)
Globulin, Total: 2.4 g/dL (ref 1.5–4.5)
Potassium: 4.4 mmol/L (ref 3.5–5.2)
Sodium: 140 mmol/L (ref 134–144)
TOTAL PROTEIN: 6.5 g/dL (ref 6.0–8.5)

## 2017-02-15 LAB — LIPID PANEL
CHOL/HDL RATIO: 3.4 ratio (ref 0.0–4.4)
CHOLESTEROL TOTAL: 179 mg/dL (ref 100–199)
HDL: 53 mg/dL (ref >39)
LDL Calculated: 112 mg/dL — ABNORMAL HIGH (ref 0–99)
Triglycerides: 70 mg/dL (ref 0–149)
VLDL CHOLESTEROL CAL: 14 mg/dL (ref 5–40)

## 2017-02-15 LAB — CBC
HEMOGLOBIN: 12.8 g/dL (ref 11.1–15.9)
Hematocrit: 41.1 % (ref 34.0–46.6)
MCH: 25.9 pg — ABNORMAL LOW (ref 26.6–33.0)
MCHC: 31.1 g/dL — ABNORMAL LOW (ref 31.5–35.7)
MCV: 83 fL (ref 79–97)
Platelets: 301 10*3/uL (ref 150–379)
RBC: 4.95 x10E6/uL (ref 3.77–5.28)
RDW: 14.1 % (ref 12.3–15.4)
WBC: 6 10*3/uL (ref 3.4–10.8)

## 2017-02-15 LAB — TSH: TSH: 1.06 u[IU]/mL (ref 0.450–4.500)

## 2017-02-15 LAB — VITAMIN D 25 HYDROXY (VIT D DEFICIENCY, FRACTURES): VIT D 25 HYDROXY: 34.2 ng/mL (ref 30.0–100.0)

## 2017-06-24 ENCOUNTER — Other Ambulatory Visit: Payer: Self-pay | Admitting: Obstetrics & Gynecology

## 2017-06-24 DIAGNOSIS — Z1231 Encounter for screening mammogram for malignant neoplasm of breast: Secondary | ICD-10-CM

## 2017-07-08 ENCOUNTER — Telehealth: Payer: Self-pay | Admitting: Obstetrics & Gynecology

## 2017-07-08 NOTE — Telephone Encounter (Signed)
Spoke with patient. Patient reports increased breast tenderness with menses for the last 2 years, right breast more than left. Has taken POP "off and on", currently back on for the last 2 weeks. Noticed larger than quarter size lump in right breast with last menses, swelling has since gone down some.   States now sore, not swollen, comes and goes and sometimes depends on POP. Denies nipple d/c or skin changes.   Patient states her caffeine intake is "high".  Patient states she is scheduled for screening MMG on 11/2, can this still be done? Advised patient will need OV prior to screening for further evaluation. Advised patient Dr. Sabra Heck is out of the office on 10/31, declined OV for 11/1 and 11/2. Advised patient can schedule with covering provider on 10/31, scheduled for 1pm with Dr. Quincy Simmonds. Patient verbalizes understanding and is agreeable.   Routing to provider for final review. Patient is agreeable to disposition. Will close encounter.  Cc: Dr. Quincy Simmonds

## 2017-07-08 NOTE — Telephone Encounter (Signed)
Patient is has a breast lump with swelling.  Patient already has a mammogram scheduled 07/11/17.

## 2017-07-09 ENCOUNTER — Ambulatory Visit: Payer: Self-pay | Admitting: Obstetrics and Gynecology

## 2017-07-09 ENCOUNTER — Telehealth: Payer: Self-pay | Admitting: Obstetrics & Gynecology

## 2017-07-09 NOTE — Progress Notes (Deleted)
GYNECOLOGY  VISIT   HPI: 46 y.o.   Married  Caucasian  female   [phone] with No LMP recorded.   here for     GYNECOLOGIC HISTORY: No LMP recorded. Contraception:  Vasectomy Menopausal hormone therapy:  none Last mammogram:  03/16/15 BIRADS1:neg -- scheduled 07/11/17 Last pap smear:   03/16/15 Neg. HR HPV:neg              04/04/09 Neg         OB History    Gravida Para Term Preterm AB Living   3 2     1 2    SAB TAB Ectopic Multiple Live Births   1                 Patient Active Problem List   Diagnosis Date Noted  . Anxiety and depression 01/19/2016  . Migraine headache 01/19/2016  . Antiphospholipid antibody positive 03/16/2015    Past Medical History:  Diagnosis Date  . Anemia    during pregnancy  . 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 Anxiety   . Migraine    had previous MRI/MRA-diag with ocular migraines    Past Surgical History:  Procedure Laterality Date  . CESAREAN SECTION     x2  . TONSILLECTOMY    . WISDOM TOOTH EXTRACTION      Current Outpatient Prescriptions  Medication Sig Dispense Refill  . norethindrone (MICRONOR,CAMILA,ERRIN) 0.35 MG tablet Take 1 tablet (0.35 mg total) by mouth daily. 3 Package 4   No current facility-administered medications for this visit.      ALLERGIES: Patient has no known allergies.  Family History  Problem Relation Age of Onset  . Endometriosis Mother        hysterectomy age 29  . Lymphoma Mother   . Diabetes Maternal Grandmother   . Colon cancer Maternal Grandmother   . Hypertension Maternal Grandmother   . Skin cancer Father   . Stroke Daughter        in utero-cerebal palsy    Social History   Social History  . Marital status: Married    Spouse name: N/A  . Number of children: N/A  . Years of education: N/A   Occupational History  . Not on file.   Social History Main Topics  . Smoking status: Never Smoker  . Smokeless tobacco: Never  Used  . Alcohol use 0.0 oz/week     Comment: glass of wine every 6 months  . Drug use: No  . Sexual activity: Yes    Partners: Male    Birth control/ protection: Surgical     Comment: vasectomy   Other Topics Concern  . Not on file   Social History Narrative  . No narrative on file    ROS:  Pertinent items are noted in HPI.  PHYSICAL EXAMINATION:    There were no vitals taken for this visit.    General appearance: alert, cooperative and appears stated age Head: Normocephalic, without obvious abnormality, atraumatic Neck: no adenopathy, supple, symmetrical, trachea midline and thyroid normal to inspection and palpation Lungs: clear to auscultation bilaterally Breasts: normal appearance, no masses or tenderness, No nipple retraction or dimpling, No nipple discharge or bleeding, No axillary or supraclavicular adenopathy Heart: regular rate and rhythm Abdomen: soft, non-tender, no masses,  no organomegaly Extremities: extremities normal, atraumatic, no cyanosis or edema Skin: Skin color, texture, turgor normal. No rashes or lesions Lymph nodes: Cervical, supraclavicular, and  axillary nodes normal. No abnormal inguinal nodes palpated Neurologic: Grossly normal  Pelvic: External genitalia:  no lesions              Urethra:  normal appearing urethra with no masses, tenderness or lesions              Bartholins and Skenes: normal                 Vagina: normal appearing vagina with normal color and discharge, no lesions              Cervix: no lesions                Bimanual Exam:  Uterus:  normal size, contour, position, consistency, mobility, non-tender              Adnexa: no mass, fullness, tenderness              Rectal exam: {yes no:314532}.  Confirms.              Anus:  normal sphincter tone, no lesions  Chaperone was present for exam.  ASSESSMENT     PLAN     An After Visit Summary was printed and given to the patient.  ______ minutes face to face time of  which over 50% was spent in counseling.

## 2017-07-09 NOTE — Telephone Encounter (Signed)
Thank you for the update.   Cc - Dr. Miller 

## 2017-07-09 NOTE — Telephone Encounter (Addendum)
Patient called and cancelled her appointment with Dr. Quincy Simmonds for today for right breast soreness due to having to work. She said she told the nurse that scheduled the appointment that this may happen. She will call back this afternoon to reschedule. Routing to triage for FYI and Dr. Quincy Simmonds who the patient was scheduled with today.

## 2017-07-10 NOTE — Telephone Encounter (Signed)
Spoke with patient, calling to reschedule OV for breast tenderness. Patient is currently scheduled for screening MMG on 07/11/17 at 12:50pm.   Patient declined OV for today, scheduled for 11:30am on 11/2 with Dr. Sabra Heck.   Patient asked if screening MMG should be cancelled? Advised patient may keep as scheduled, RN can call [facility] while in the office if Dr. Sabra Heck determines diagnostic imaging is needed. Advised patient Dr. Sabra Heck will review, will return call with any additional recommendations. Patient verbalizes understanding and is agreeable.   Routing to provider for final review. Patient is agreeable to disposition. Will close encounter.

## 2017-07-11 ENCOUNTER — Ambulatory Visit (INDEPENDENT_AMBULATORY_CARE_PROVIDER_SITE_OTHER): Payer: 59 | Admitting: Obstetrics & Gynecology

## 2017-07-11 ENCOUNTER — Ambulatory Visit
Admission: RE | Admit: 2017-07-11 | Discharge: 2017-07-11 | Disposition: A | Discharge status: Home / Self Care | Payer: 59 | Source: Ambulatory Visit | Attending: Obstetrics & Gynecology | Admitting: Obstetrics & Gynecology

## 2017-07-11 VITALS — BP 94/60 | HR 74 | Resp 16 | Ht 63.25 in | Wt 121.0 lb

## 2017-07-11 DIAGNOSIS — N644 Mastodynia: Secondary | ICD-10-CM | POA: Diagnosis not present

## 2017-07-11 DIAGNOSIS — Z789 Other specified health status: Secondary | ICD-10-CM | POA: Diagnosis not present

## 2017-07-11 DIAGNOSIS — Z1231 Encounter for screening mammogram for malignant neoplasm of breast: Secondary | ICD-10-CM

## 2017-07-11 NOTE — Progress Notes (Signed)
GYNECOLOGY  VISIT  CC:   Breast pain  HPI: 46 y.o. [id number] Married Caucasian female here for complaint of breast pain that has been present and worsening right before her cycle for the past year.  She has felt this was "hormonal".  She stopped the POPs for 4-6 weeks but she felt more moody while being off so she restarted the POPs about 10 days ago.  Denies nipple discharge.  She has skipped many cycles this past year.  Pt has skipped up to eight weeks this year between cycles.  Longest cycle has been 3 days.    Last MMG was 03/16/15.  Pt has scheduled a MMG for today.     Pt working in [place] now and reports [employer] is free and available all of the time.  Aware she is drinking a lot more caffeine the past many months but just can't seem to stop drinking it.  GYNECOLOGIC HISTORY: LMP:  06/18/17 Contraception: vasectomy Menopausal hormone therapy: none  Patient Active Problem List   Diagnosis Date Noted  . Anxiety and depression 01/19/2016  . Migraine headache 01/19/2016  . Antiphospholipid antibody positive 03/16/2015    Past Medical History:  Diagnosis Date  . Anemia    during pregnancy  . 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 Anxiety   . Migraine    had previous MRI/MRA-diag with ocular migraines    Past Surgical History:  Procedure Laterality Date  . CESAREAN SECTION     x2  . TONSILLECTOMY    . WISDOM TOOTH EXTRACTION      MEDS:   Current Outpatient Medications on File Prior to Visit  Medication Sig Dispense Refill  . norethindrone (MICRONOR,CAMILA,ERRIN) 0.35 MG tablet Take 1 tablet (0.35 mg total) by mouth daily. 3 Package 4   No current facility-administered medications on file prior to visit.     ALLERGIES: Patient has no known allergies.  Family History  Problem Relation Age of Onset  . Endometriosis Mother        hysterectomy age 20  . Lymphoma Mother   . Diabetes Maternal  Grandmother   . Colon cancer Maternal Grandmother   . Hypertension Maternal Grandmother   . Skin cancer Father   . Stroke Daughter        in utero-cerebal palsy    SH:  Married, non smoker  Review of Systems  Constitutional: Negative.   Respiratory: Negative.   Cardiovascular: Negative.   Neurological: Negative.   Psychiatric/Behavioral: Negative.     PHYSICAL EXAMINATION:    BP 94/60 (BP Location: Right Arm, Patient Position: Sitting, Cuff Size: Normal)   Pulse 74   Resp 16   Ht 5' 3.25" (1.607 m)   Wt 121 lb (54.9 kg)   LMP 01/22/2017 (Approximate)   BMI 21.27 kg/m     Physical Exam  Constitutional: She appears well-developed and well-nourished.  Neck: Normal range of motion. Neck supple. No thyromegaly present.  Cardiovascular: Normal rate and regular rhythm.  Respiratory: Effort normal and breath sounds normal. Right breast exhibits tenderness. Right breast exhibits no inverted nipple, no mass, no nipple discharge and no skin change. Left breast exhibits no inverted nipple, no mass, no nipple discharge, no skin change and no tenderness. Breasts are symmetrical.    Lymphadenopathy:    She has no cervical adenopathy.    Chaperone was present for exam.  Assessment: Breast tenderness on right without discrete mass High caffeine intake  with [employer]   Plan: Screening MMG with 3D recommended Pt also recommended to significantly decrease her caffeine intake.

## 2017-07-13 ENCOUNTER — Encounter: Payer: Self-pay | Admitting: Obstetrics & Gynecology

## 2017-07-15 ENCOUNTER — Other Ambulatory Visit: Payer: Self-pay | Admitting: Obstetrics & Gynecology

## 2017-07-15 DIAGNOSIS — R928 Other abnormal and inconclusive findings on diagnostic imaging of breast: Secondary | ICD-10-CM

## 2017-07-17 ENCOUNTER — Ambulatory Visit
Admission: RE | Admit: 2017-07-17 | Discharge: 2017-07-17 | Disposition: A | Discharge status: Home / Self Care | Payer: 59 | Source: Ambulatory Visit | Attending: Obstetrics & Gynecology | Admitting: Obstetrics & Gynecology

## 2017-07-17 DIAGNOSIS — R928 Other abnormal and inconclusive findings on diagnostic imaging of breast: Secondary | ICD-10-CM

## 2018-02-23 ENCOUNTER — Ambulatory Visit: Payer: 59 | Admitting: Obstetrics & Gynecology

## 2018-04-30 ENCOUNTER — Other Ambulatory Visit (HOSPITAL_COMMUNITY)
Admission: RE | Admit: 2018-04-30 | Discharge: 2018-04-30 | Disposition: A | Discharge status: Home / Self Care | Payer: 59 | Source: Ambulatory Visit | Attending: Obstetrics & Gynecology | Admitting: Obstetrics & Gynecology

## 2018-04-30 ENCOUNTER — Encounter: Payer: Self-pay | Admitting: Obstetrics & Gynecology

## 2018-04-30 ENCOUNTER — Ambulatory Visit (INDEPENDENT_AMBULATORY_CARE_PROVIDER_SITE_OTHER): Payer: 59 | Admitting: Obstetrics & Gynecology

## 2018-04-30 ENCOUNTER — Other Ambulatory Visit: Payer: Self-pay

## 2018-04-30 VITALS — BP 94/60 | HR 76 | Resp 18 | Ht 62.75 in | Wt 115.6 lb

## 2018-04-30 DIAGNOSIS — Z01419 Encounter for gynecological examination (general) (routine) without abnormal findings: Secondary | ICD-10-CM

## 2018-04-30 DIAGNOSIS — Z124 Encounter for screening for malignant neoplasm of cervix: Secondary | ICD-10-CM

## 2018-04-30 DIAGNOSIS — N289 Disorder of kidney and ureter, unspecified: Secondary | ICD-10-CM

## 2018-04-30 DIAGNOSIS — Z0000 Encounter for general adult medical examination without abnormal findings: Secondary | ICD-10-CM

## 2018-04-30 DIAGNOSIS — Z1211 Encounter for screening for malignant neoplasm of colon: Secondary | ICD-10-CM | POA: Diagnosis not present

## 2018-04-30 MED ORDER — NITROFURANTOIN MONOHYD MACRO 100 MG PO CAPS: 100.0000 mg | ORAL_CAPSULE | Freq: Every day | ORAL | 0 refills | Status: DC

## 2018-04-30 NOTE — Progress Notes (Signed)
47 y.o. [id number] MarriedCaucasianF here for annual exam.  Doing well.  Cycles are irregular now.  Has skipped 4 months this year.  Last cycle was light and two days.    Daughter is a Equities trader and she desires to go to away to school.  She did see Rachel Moulds, MD, last year.  Did try medication but had side effects.  Does feel much better since cycles are less frequent.  Patient's last menstrual period was 03/23/2018 (approximate).          Sexually active: Yes.    The current method of family planning is vasectomy.    Exercising: Yes.    cardio Smoker:  no  Health Maintenance: Pap:  03/16/15 Neg. HR HPV:neg  History of abnormal Pap:  no MMG:  07/17/17 [place] Right BIRADS2:Benign. F/u 1 year Colonoscopy:  Not obtained.  New ACS guidelines reviewed.    TDaP:  2016 Hep C testing: done Screening Labs: Here today   reports that she has never smoked. She has never used smokeless tobacco. She reports that she drinks about 2.0 standard drinks of alcohol per week. She reports that she does not use drugs.  Past Medical History:  Diagnosis Date  . Anemia    during pregnancy  . 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 Anxiety   . Migraine    had previous MRI/MRA-diag with ocular migraines    Past Surgical History:  Procedure Laterality Date  . CESAREAN SECTION     x2  . TONSILLECTOMY    . WISDOM TOOTH EXTRACTION      No current outpatient medications on file.   No current facility-administered medications for this visit.     Family History  Problem Relation Age of Onset  . Endometriosis Mother        hysterectomy age 73  . Lymphoma Mother   . Diabetes Maternal Grandmother   . Colon cancer Maternal Grandmother   . Hypertension Maternal Grandmother   . Skin cancer Father   . Stroke Daughter        in utero    Review of Systems  Endo/Heme/Allergies:       Cold intolerance   All other systems reviewed and are  negative.   Exam:   BP 94/60 (BP Location: Right Arm, Patient Position: Sitting, Cuff Size: Normal)   Pulse 76   Resp 18   Ht 5' 2.75" (1.594 m)   Wt 115 lb 9.6 oz (52.4 kg)   LMP 03/23/2018 (Approximate)   BMI 20.64 kg/m    Height: 5' 2.75" (159.4 cm)  Ht Readings from Last 3 Encounters:  04/30/18 5' 2.75" (1.594 m)  07/11/17 5' 3.25" (1.607 m)  02/14/17 5' 3.25" (1.607 m)    General appearance: alert, cooperative and appears stated age Head: Normocephalic, without obvious abnormality, atraumatic Neck: no adenopathy, supple, symmetrical, trachea midline and thyroid normal to inspection and palpation Lungs: clear to auscultation bilaterally Breasts: normal appearance, no masses or tenderness Heart: regular rate and rhythm Abdomen: soft, non-tender; bowel sounds normal; no masses,  no organomegaly Extremities: extremities normal, atraumatic, no cyanosis or edema Skin: Skin color, texture, turgor normal. No rashes or lesions Lymph nodes: Cervical, supraclavicular, and axillary nodes normal. No abnormal inguinal nodes palpated Neurologic: Grossly normal   Pelvic: External genitalia:  no lesions              Urethra:  normal appearing urethra with no masses, tenderness or  lesions              Bartholins and Skenes: normal                 Vagina: normal appearing vagina with normal color and discharge, no lesions              Cervix: no lesions              Pap taken: Yes.   Bimanual Exam:  Uterus:  normal size, contour, position, consistency, mobility, non-tender              Adnexa: normal adnexa and no mass, fullness, tenderness               Rectovaginal: Confirms               Anus:  normal sphincter tone, no lesions  Chaperone was present for exam.  A:  Well Woman with normal exam Peri menstrual cycle changes H/o UTI's H/O antiphospholipid antibodies and daughter had intrauterine stroke Ocular migraines Family hx of colon cancer in MGM, diagnosed in her 63's  P:    Mammogram guidelines reviewed.  Recommended 3D.  pap smear return annually or prn

## 2018-04-30 NOTE — Patient Instructions (Signed)
Femdophilus vaginal/urinary tract health 

## 2018-05-01 LAB — CYTOLOGY - PAP
DIAGNOSIS: NEGATIVE
HPV: NOT DETECTED

## 2018-05-01 LAB — COMPREHENSIVE METABOLIC PANEL
A/G RATIO: 1.7 (ref 1.2–2.2)
ALT: 12 IU/L (ref 0–32)
AST: 23 IU/L (ref 0–40)
Albumin: 4.3 g/dL (ref 3.5–5.5)
Alkaline Phosphatase: 38 IU/L — ABNORMAL LOW (ref 39–117)
BUN/Creatinine Ratio: 19 (ref 9–23)
BUN: 20 mg/dL (ref 6–24)
Bilirubin Total: 0.5 mg/dL (ref 0.0–1.2)
CALCIUM: 9.7 mg/dL (ref 8.7–10.2)
CO2: 22 mmol/L (ref 20–29)
Chloride: 101 mmol/L (ref 96–106)
Creatinine, Ser: 1.03 mg/dL — ABNORMAL HIGH (ref 0.57–1.00)
GFR calc Af Amer: 75 mL/min/{1.73_m2} (ref >59)
GFR, EST NON AFRICAN AMERICAN: 65 mL/min/{1.73_m2} (ref >59)
GLOBULIN, TOTAL: 2.6 g/dL (ref 1.5–4.5)
Glucose: 79 mg/dL (ref 65–99)
POTASSIUM: 4.4 mmol/L (ref 3.5–5.2)
SODIUM: 139 mmol/L (ref 134–144)
Total Protein: 6.9 g/dL (ref 6.0–8.5)

## 2018-05-01 LAB — CBC
HEMATOCRIT: 37.9 % (ref 34.0–46.6)
Hemoglobin: 12.4 g/dL (ref 11.1–15.9)
MCH: 27.5 pg (ref 26.6–33.0)
MCHC: 32.7 g/dL (ref 31.5–35.7)
MCV: 84 fL (ref 79–97)
Platelets: 305 10*3/uL (ref 150–450)
RBC: 4.51 x10E6/uL (ref 3.77–5.28)
RDW: 15 % (ref 12.3–15.4)
WBC: 6.5 10*3/uL (ref 3.4–10.8)

## 2018-05-01 LAB — LIPID PANEL
CHOL/HDL RATIO: 3.2 ratio (ref 0.0–4.4)
Cholesterol, Total: 208 mg/dL — ABNORMAL HIGH (ref 100–199)
HDL: 65 mg/dL (ref >39)
LDL Calculated: 130 mg/dL — ABNORMAL HIGH (ref 0–99)
TRIGLYCERIDES: 67 mg/dL (ref 0–149)
VLDL Cholesterol Cal: 13 mg/dL (ref 5–40)

## 2018-05-01 LAB — TSH: TSH: 1.07 u[IU]/mL (ref 0.450–4.500)

## 2018-05-01 LAB — VITAMIN D 25 HYDROXY (VIT D DEFICIENCY, FRACTURES): VIT D 25 HYDROXY: 34.9 ng/mL (ref 30.0–100.0)

## 2018-05-05 ENCOUNTER — Telehealth: Payer: Self-pay | Admitting: *Deleted

## 2018-05-05 NOTE — Addendum Note (Signed)
Addended by: Megan Salon on: 05/05/2018 12:44 PM   Modules accepted: Orders

## 2018-05-05 NOTE — Telephone Encounter (Signed)
Notes recorded by Polly Cobia, CMA on 05/05/2018 at 2:19 PM EDT LM for pt to call back. AEX scheduled 08/20/19 ------  Notes recorded by Megan Salon, MD on 05/05/2018 at 12:43 PM EDT 02 recall.   Please let pt know her CBC, TSH and Vit D were normal. Lipids were mildly elevated at 208 and LDLs were 130. I don't think the test was a fasting test. So, will repeat fasting next year.  Her CMP showed a mildly elevated creatinine. I would recommend repeating this and making sure that she's had adequate water for at least two days prior to the test. This should not be fasting. Order for lab placed. Should repeat 2-4 weeks after initial testing. Thanks.  Vinnie Level

## 2018-05-08 NOTE — Telephone Encounter (Signed)
Pt notified. Verbalized understanding. Patient will call back to schedule lab appt.

## 2018-06-16 ENCOUNTER — Other Ambulatory Visit: Payer: Self-pay | Admitting: *Deleted

## 2018-06-16 ENCOUNTER — Other Ambulatory Visit (INDEPENDENT_AMBULATORY_CARE_PROVIDER_SITE_OTHER): Payer: 59

## 2018-06-16 DIAGNOSIS — N289 Disorder of kidney and ureter, unspecified: Secondary | ICD-10-CM

## 2018-06-17 LAB — CREATININE, SERUM
CREATININE: 0.99 mg/dL (ref 0.57–1.00)
GFR, EST AFRICAN AMERICAN: 78 mL/min/{1.73_m2} (ref >59)
GFR, EST NON AFRICAN AMERICAN: 68 mL/min/{1.73_m2} (ref >59)

## 2018-10-07 ENCOUNTER — Ambulatory Visit (INDEPENDENT_AMBULATORY_CARE_PROVIDER_SITE_OTHER): Payer: 59 | Admitting: Family Medicine

## 2018-10-07 ENCOUNTER — Other Ambulatory Visit: Payer: Self-pay

## 2018-10-07 ENCOUNTER — Encounter: Payer: Self-pay | Admitting: Family Medicine

## 2018-10-07 VITALS — BP 98/64 | HR 66 | Temp 98.5°F | Resp 14 | Ht 62.5 in | Wt 118.2 lb

## 2018-10-07 DIAGNOSIS — N951 Menopausal and female climacteric states: Secondary | ICD-10-CM | POA: Diagnosis not present

## 2018-10-07 DIAGNOSIS — G43109 Migraine with aura, not intractable, without status migrainosus: Secondary | ICD-10-CM | POA: Diagnosis not present

## 2018-10-07 DIAGNOSIS — F902 Attention-deficit hyperactivity disorder, combined type: Secondary | ICD-10-CM | POA: Diagnosis not present

## 2018-10-07 DIAGNOSIS — F338 Other recurrent depressive disorders: Secondary | ICD-10-CM | POA: Insufficient documentation

## 2018-10-07 DIAGNOSIS — F988 Other specified behavioral and emotional disorders with onset usually occurring in childhood and adolescence: Secondary | ICD-10-CM | POA: Insufficient documentation

## 2018-10-07 HISTORY — DX: Other recurrent depressive disorders: F33.8

## 2018-10-07 MED ORDER — ATOMOXETINE HCL 40 MG PO CAPS: 40.0000 mg | ORAL_CAPSULE | Freq: Every day | ORAL | 2 refills | Status: DC

## 2018-10-07 NOTE — Progress Notes (Signed)
Subjective  CC:  Chief Complaint  Patient presents with  . Establish Care    Previous pt of [facility], Dr. Sabra Heck (GYN) does most of her care  . Depression    States that it is seasonal, states all medication that she has tried she has bad side effects    HPI: Karen Lozano is a 48 y.o. female who presents to [place] at [facility] today to establish care with me as a new patient.  I reviewed recent GYN records. Pap nl and neg HR HPV 04/2018, nl cbc, cmp and lipids. She has the following concerns or needs:  Mood and ADD: reports several year h/o SAD treated with lexapro and maybe wellbutrin but couldn't tolerate due SE: depressed libido and poor sleep. ADD stimulants helped but interfered with sleep as well.  Reports negative self thoughts and irritability with poor coping skills exacerbated by perimenopausal sxs and ADD.  Depression screen [facility] 2/9 10/07/2018 10/07/2018  Decreased Interest 1 1  Down, Depressed, Hopeless 0 0  PHQ - 2 Score 1 1  Altered sleeping 2 -  Tired, decreased energy 1 -  Change in appetite 1 -  Feeling bad or failure about yourself  3 -  Trouble concentrating 2 -  Moving slowly or fidgety/restless 2 -  Suicidal thoughts 0 -  PHQ-9 Score 12 -  Difficult doing work/chores Somewhat difficult -     Assessment  1. Attention deficit hyperactivity disorder (ADHD), combined type   2. Seasonal affective disorder (HCC)   3. Ocular migraine   4. Perimenopausal      Plan   Today's visit was 30 minutes long. Greater than 50% of this time was devoted to face to face counseling with the patient and coordination of care. We discussed her diagnosis, prognosis, treatment options and treatment plan is documented below.    ADD: need to see if we can get this better controlled: this will help her mood. Trial of stratterra. Recheck 4 weeks.  SAD: start light therapy. Will consider rechallenge with SSRI.   Perimenopause: can't use HRT. Consider  valareian root or melatonin for sleep if needed.   Migraines: more historical at this time.   Follow up:  Return in about 4 weeks (around 11/04/2018) for follow up on ADD. No orders of the defined types were placed in this encounter.  Meds ordered this encounter  Medications  . atomoxetine (STRATTERA) 40 MG capsule    Sig: Take 1 capsule (40 mg total) by mouth daily.    Dispense:  30 capsule    Refill:  2     Depression screen [facility] 2/9 10/07/2018 10/07/2018  Decreased Interest 1 1  Down, Depressed, Hopeless 0 0  PHQ - 2 Score 1 1  Altered sleeping 2 -  Tired, decreased energy 1 -  Change in appetite 1 -  Feeling bad or failure about yourself  3 -  Trouble concentrating 2 -  Moving slowly or fidgety/restless 2 -  Suicidal thoughts 0 -  PHQ-9 Score 12 -  Difficult doing work/chores Somewhat difficult -   GAD 7 : Generalized Anxiety Score 10/07/2018  Nervous, Anxious, on Edge 2  Control/stop worrying 1  Worry too much - different things 1  Trouble relaxing 3  Restless 2  Easily annoyed or irritable 1  Afraid - awful might happen 0  Total GAD 7 Score 10  Anxiety Difficulty Somewhat difficult     We updated and reviewed the patient's past history  in detail and it is documented below.  Patient Active Problem List   Diagnosis Date Noted  . 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 Perimenopausal 10/07/2018    HRT is contraindicated   . ADD (attention deficit disorder) 10/07/2018    [facility] evaluation 2017: started meds but interfered with sleep. Adderall, ritalin. Trial of stratterra initiated 09/2018; consider intuniv as well    . Seasonal affective disorder ([place]) 10/07/2018    Tried lexapro and one other SSRI: depressed libido and couldn't sleep   . Migraine headache 01/19/2016  . Antiphospholipid antibody positive 03/16/2015   Health Maintenance  Topic Date Due  . HIV Screening  05/26/1985  . PAP SMEAR-Modifier  05/01/2023  . TETANUS/TDAP  09/09/2024    . INFLUENZA VACCINE  Completed    There is no immunization history on file for this patient. No outpatient medications have been marked as taking for the 10/07/18 encounter (Office Visit) with Leamon Arnt, MD.    Allergies: Patient has No Known Allergies. Past Medical History Patient  has a past medical history of Anticardiolipin antibody positive, Anxiety, History of anemia, Migraine, and Seasonal affective disorder ([place]) (10/07/2018). Past Surgical History Patient  has a past surgical history that includes Cesarean section; Tonsillectomy; and Wisdom tooth extraction. Family History: Patient family history includes ADD / ADHD in her daughter; Basal cell carcinoma in her father; Colon cancer in her maternal grandmother; Diabetes in her maternal grandmother; Endometriosis in her mother; Hypertension in her maternal grandmother; Learning disabilities in her daughter; Lymphoma in her mother; Stroke in her daughter and father. Social History:  Patient  reports that she has never smoked. She has never used smokeless tobacco. She reports current alcohol use of about 2.0 standard drinks of alcohol per week. She reports that she does not use drugs.  Review of Systems: Constitutional: negative for fever or malaise Ophthalmic: negative for photophobia, double vision or loss of vision Cardiovascular: negative for chest pain, dyspnea on exertion, or new LE swelling Respiratory: negative for SOB or persistent cough Gastrointestinal: negative for abdominal pain, change in bowel habits or melena Genitourinary: negative for dysuria or gross hematuria Musculoskeletal: negative for new gait disturbance or muscular weakness Integumentary: negative for new or persistent rashes Neurological: negative for TIA or stroke symptoms Psychiatric: negative for SI or delusions Allergic/Immunologic: negative for hives  Patient Care Team    Relationship Specialty Notifications Start End  Leamon Arnt, MD PCP  - General Family Medicine  10/07/18   Megan Salon, MD Consulting Physician Gynecology  10/07/18     Objective  Vitals: BP 98/64   Pulse 66   Temp 98.5 F (36.9 C) (Oral)   Resp 14   Ht 5' 2.5" (1.588 m)   Wt 118 lb 3.2 oz (53.6 kg)   LMP 10/01/2018   SpO2 96%   BMI 21.27 kg/m  General:  Well developed, well nourished, no acute distress  Psych:  Alert and oriented,normal mood and affect    Commons side effects, risks, benefits, and alternatives for medications and treatment plan prescribed today were discussed, and the patient expressed understanding of the given instructions. Patient is instructed to call or message via MyChart if he/she has any questions or concerns regarding our treatment plan. No barriers to understanding were identified. We discussed Red Flag symptoms and signs in detail. Patient expressed understanding regarding what to do in case of urgent or emergency type symptoms.   Medication list was reconciled, printed and provided to the patient in  AVS. Patient instructions and summary information was reviewed with the patient as documented in the AVS. This note was prepared with assistance of Dragon voice recognition software. Occasional wrong-word or sound-a-like substitutions may have occurred due to the inherent limitations of voice recognition software

## 2018-10-07 NOTE — Patient Instructions (Signed)
Please return in 4 weeks to recheck ADD.  It was a pleasure meeting you today! Thank you for choosing [place] to meet your healthcare needs! I truly look forward to working with you. If you have any questions or concerns, please send me a message via Mychart or call the office at [phone].   Attention Deficit Hyperactivity Disorder, Adult Attention deficit hyperactivity disorder (ADHD) is a mental health disorder that starts during childhood. For many people with ADHD, the disorder continues into adult years. There are many things that you and your health care provider or therapist (mental health professional) can do to manage your symptoms. What are the causes? The exact cause of ADHD is not known. What increases the risk? You are more likely to develop this condition if:  You have a family history of ADHD.  You are female.  You were born to a mother who smoked or drank alcohol during pregnancy.  You were exposed to lead poisoning or other toxins in the womb or in early life.  You were born before 81 weeks of pregnancy (prematurely) or you had a low birth weight.  You have experienced a brain injury. What are the signs or symptoms? Symptoms of this condition depend on the type of ADHD. The two main types are inattentive and hyperactive-impulsive. Some people may have symptoms of both types. Symptoms of the inattentive type include:  Difficulty watching, listening, or thinking with focused effort (paying attention).  Making careless mistakes.  Not listening.  Not following instructions.  Being disorganized.  Avoiding tasks that require time and attention.  Losing things.  Forgetting things.  Being easily distracted. Symptoms of the hyperactive-impulsive type include:  Restlessness.  Talking too much.  Interrupting.  Difficulty with: ? Sitting still. ? Staying quiet. ? Feeling motivated. ? Relaxing. ? Waiting in line or waiting for a turn. How is this  diagnosed? This condition is diagnosed based on your current symptoms and your history of symptoms. The diagnosis can be made by a provider such as a primary care provider, psychiatrist, psychologist, or clinical social worker. The provider may use a symptom checklist or a standardized behavior rating scale to evaluate your symptoms. He or she may want to talk with family members who have known you for a long time and have observed your behaviors. There are no lab tests or brain imaging tests that can diagnose ADHD. How is this treated? This condition can be treated with medicines and behavior therapy. Medicines may be the best option to reduce impulsive behaviors and improve attention. Your health care provider may recommend:  Stimulant medicines. These are the most common medicines used for adult ADHD. They affect certain chemicals in the brain (neurotransmitters). These medicines may be long-acting or short-acting. This will determine how often you need to take the medicine.  A non-stimulant medicine for adult ADHD (atomoxetine). This medicine increases a neurotransmitter called norepinephrine. It may take weeks to months to see effects from this medicine. Psychotherapy and behavioral management are also important for treating ADHD. Psychotherapy is often used along with medicine. Your health care provider may suggest:  Cognitive behavioral therapy (CBT). This type of therapy teaches you to replace negative thoughts and actions with positive thoughts and actions. When used as part of ADHD treatment, this therapy may also include: ? Coping strategies for organization, time management, impulse control, and stress reduction. ? Mindfulness and meditation training.  Behavioral management. This may include strategies for organization and time management. You may work with an  ADHD coach who is specially trained to help people with ADHD to manage and organize activities and to function more  effectively. Follow these instructions at home: Medicines   Take over-the-counter and prescription medicines only as told by your health care provider.  Talk with your health care provider about the possible side effects of your medicine to watch for. General instructions   Learn as much as you can about adult ADHD, and work closely with your health care providers to find the treatments that work best for you.  Do not use drugs or abuse alcohol. Limit alcohol intake to no more than 1 drink a day for nonpregnant women and 2 drinks a day for men. One drink equals 12 oz of beer, 5 oz of wine, or 1 oz of hard liquor.  Follow the same schedule each day. Make sure your schedule includes enough time for you to get plenty of sleep.  Use reminder devices like notes, calendars, and phone apps to stay on-time and organized.  Eat a healthy diet. Do not skip meals.  Exercise regularly. Exercise can help to reduce stress and anxiety.  Keep all follow-up visits as told by your health care provider and therapist. This is important. Where to find more information  A health care provider may be able to recommend resources that are available online or over the phone. You could start with: ? Attention Deficit Disorder Association (ADDA): PubAddiction.co.nz ? [facility] https://carter.com/ Contact a health care provider if:  Your symptoms are changing, getting worse, or not improving.  You have side effects from your medicine, such as: ? Repeated muscle twitches, coughing, or speech outbursts. ? Sleep problems. ? Loss of appetite. ? Depression. ? New or worsening behavior problems. ? Dizziness. ? Unusually fast heartbeat. ? Stomach pains. ? Headaches.  You are struggling with anxiety, depression, or substance abuse. Get help right away if:  You have a severe reaction to a medicine.  You have thoughts of hurting yourself or others. If you ever feel like you may  hurt yourself or others, or have thoughts about taking your own life, get help right away. You can go to the nearest emergency department or call:  Your local emergency services (911 in the [place]  A suicide crisis helpline, such as the [facility] at 231-088-6773. This is open 24 hours a day. Summary  ADHD is a mental health disorder that starts during childhood and often continues into adult years.  The exact cause of ADHD is not known.  There is no cure for ADHD, but treatment with medicine, therapy, or behavioral training can help you manage your condition. This information is not intended to replace advice given to you by your health care provider. Make sure you discuss any questions you have with your health care provider. Document Released: 04/17/2017 Document Revised: 04/17/2017 Document Reviewed: 04/17/2017 Elsevier Interactive Patient Education  2019 [employer].

## 2018-11-11 ENCOUNTER — Encounter: Payer: Self-pay | Admitting: Family Medicine

## 2018-11-11 ENCOUNTER — Other Ambulatory Visit: Payer: Self-pay

## 2018-11-11 ENCOUNTER — Ambulatory Visit (INDEPENDENT_AMBULATORY_CARE_PROVIDER_SITE_OTHER): Payer: 59 | Admitting: Family Medicine

## 2018-11-11 VITALS — BP 96/66 | HR 81 | Temp 97.8°F | Resp 14 | Ht 63.0 in | Wt 117.4 lb

## 2018-11-11 DIAGNOSIS — R232 Flushing: Secondary | ICD-10-CM | POA: Diagnosis not present

## 2018-11-11 DIAGNOSIS — F338 Other recurrent depressive disorders: Secondary | ICD-10-CM

## 2018-11-11 DIAGNOSIS — F902 Attention-deficit hyperactivity disorder, combined type: Secondary | ICD-10-CM | POA: Diagnosis not present

## 2018-11-11 DIAGNOSIS — N951 Menopausal and female climacteric states: Secondary | ICD-10-CM | POA: Diagnosis not present

## 2018-11-11 MED ORDER — GABAPENTIN 100 MG PO CAPS: 100.0000 mg | ORAL_CAPSULE | Freq: Every evening | ORAL | 0 refills | Status: DC | PRN

## 2018-11-11 NOTE — Progress Notes (Signed)
Subjective  CC:  Chief Complaint  Patient presents with  . ADD    Started on STRATTERA last month notices some improvement but reports that she is having some insomnia    HPI: Karen Lozano is a 48 y.o. female who presents to the office today to address the problems listed above in the chief complaint.  Patient is here today for follow up of ADD/ADHD.  Also here to follow-up on perimenopausal symptoms and insomnia  ADD: Started Strattera 40 mg a month ago.  She definitely has noted improvement.  Able to stay on task longer, less distractibility which in turn helps with less irritability.  She noticed she has more patience as well.  However sleep has worsened.  No adverse effects.  Perimenopausal vasomotor flushes interfering with sleep: Hot flushes are multiple times during the night and very bothersome.  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  She does not really want to take another "pill".  She is changed jobs and is now working 12-hour shifts and feels that getting a good nights rest is paramount.  She is been using over-the-counter nighttime cough medicine to help her.  She has failed melatonin.  Seasonal affective disorder: Now that spring is coming she feels better.  Still wanted to avoid an SSRI. Assessment  1. Attention deficit hyperactivity disorder (ADHD), combined type   2. Perimenopausal   3. Seasonal affective disorder ([place])   4. Vasomotor flushing      Plan   Today's visit was 30 minutes long. Greater than 50% of this time was devoted to face to face counseling with the patient and coordination of care. We discussed her diagnosis, prognosis, treatment options and treatment plan is documented below.    ADD: Improvement with Strattera.  We will continue for the next 4 weeks, and consider titrating dose up if needed.  Vasomotor flushing/perimenopausal: Long discussion regarding treatment  options.  Poor sleep can affect her mood and her ADD so want to address this.  Patient would like to stay with alternative/nonhormonal/nonprescriptive therapies if possible.  Discussed herbal supplements including valerian root, women health soy and vital estrogen supplements.  Consider valerian root.  Also would recommend nightly gabapentin.  Risk and benefits discussed.  Patient will try several of these and we will recheck in 4 weeks.  Vasomotor and sad: Still considering SSRI rechallenge.  Follow up: Return in about 49 weeks (around 12/09/2018) for recheck.  No orders of the defined types were placed in this encounter.  Meds ordered this encounter  Medications  . gabapentin (NEURONTIN) 100 MG capsule    Sig: Take 1-3 capsules (100-300 mg total) by mouth at bedtime as needed.    Dispense:  90 capsule    Refill:  0      I reviewed the patients updated PMH, FH, and SocHx.    Patient Active Problem List   Diagnosis Date Noted  . 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 Perimenopausal 10/07/2018  . ADD (attention deficit disorder) 10/07/2018  . Seasonal affective disorder ([place]) 10/07/2018  . Migraine headache 01/19/2016  . 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 atomoxetine (STRATTERA) 40 MG capsule Take 1 capsule (40 mg total) by mouth daily.    Allergies: Patient has No Known Allergies. Family History: Patient family history includes ADD / ADHD in her daughter; Basal cell carcinoma in her father; Colon cancer in her maternal grandmother; Diabetes in  her maternal grandmother; Endometriosis in her mother; Hypertension in her maternal grandmother; Learning disabilities in her daughter; Lymphoma in her mother; Stroke in her daughter and father. Social History:  Patient  reports that she has never smoked. She has never used smokeless tobacco. She reports current alcohol use of about 2.0 standard drinks of alcohol per week. She reports that she does not use  drugs.  Review of Systems: Constitutional: Negative for fever malaise or anorexia Cardiovascular: negative for chest pain Respiratory: negative for SOB or persistent cough Gastrointestinal: negative for abdominal pain  Objective  Vitals: BP 96/66   Pulse 81   Temp 97.8 F (36.6 C) (Oral)   Resp 14   Ht 5\' 3"  (1.6 m)   Wt 117 lb 6.4 oz (53.3 kg)   LMP 10/01/2018   SpO2 98%   BMI 20.80 kg/m  General: no acute distress , A&Ox3    Commons side effects, risks, benefits, and alternatives for medications and treatment plan prescribed today were discussed, and the patient expressed understanding of the given instructions. Patient is instructed to call or message via MyChart if he/she has any questions or concerns regarding our treatment plan. No barriers to understanding were identified. We discussed Red Flag symptoms and signs in detail. Patient expressed understanding regarding what to do in case of urgent or emergency type symptoms.   Medication list was reconciled, printed and provided to the patient in AVS. Patient instructions and summary information was reviewed with the patient as documented in the AVS. This note was prepared with assistance of Dragon voice recognition software. Occasional wrong-word or sound-a-like substitutions may have occurred due to the inherent limitations of voice recognition software

## 2018-11-11 NOTE — Patient Instructions (Signed)
Please return in 4 week for recheck.  If you have any questions or concerns, please don't hesitate to send me a message via MyChart or call the office at [phone]. Thank you for visiting with [place] today! It's our pleasure caring for you.    For hot flushes and sleep: try an herbal product: can include multiple ingredients, many include phyto estrogens or soy.  Valerian root can be helpful.  Women's Health: [employer]  Try the gabapentin.

## 2018-12-09 ENCOUNTER — Ambulatory Visit: Payer: 59 | Admitting: Family Medicine

## 2018-12-22 ENCOUNTER — Other Ambulatory Visit: Payer: Self-pay | Admitting: Family Medicine

## 2019-02-26 ENCOUNTER — Telehealth: Payer: Self-pay | Admitting: Obstetrics & Gynecology

## 2019-02-26 NOTE — Telephone Encounter (Signed)
Spoke with patient. She complains of severe hot flashes and insomnia. Requests appt.with Dr.Miller. appt. Made 03-05-19 11:30am.

## 2019-02-26 NOTE — Telephone Encounter (Signed)
Patient would like an appointment to discuss hormone replacement.

## 2019-03-01 ENCOUNTER — Other Ambulatory Visit: Payer: Self-pay | Admitting: Obstetrics & Gynecology

## 2019-03-01 DIAGNOSIS — Z1231 Encounter for screening mammogram for malignant neoplasm of breast: Secondary | ICD-10-CM

## 2019-03-05 ENCOUNTER — Ambulatory Visit (INDEPENDENT_AMBULATORY_CARE_PROVIDER_SITE_OTHER): Payer: 59 | Admitting: Obstetrics & Gynecology

## 2019-03-05 ENCOUNTER — Other Ambulatory Visit: Payer: Self-pay

## 2019-03-05 ENCOUNTER — Encounter: Payer: Self-pay | Admitting: Obstetrics & Gynecology

## 2019-03-05 VITALS — BP 100/60 | HR 76 | Temp 97.8°F | Ht 63.0 in | Wt 118.0 lb

## 2019-03-05 DIAGNOSIS — R760 Raised antibody titer: Secondary | ICD-10-CM

## 2019-03-05 DIAGNOSIS — N951 Menopausal and female climacteric states: Secondary | ICD-10-CM

## 2019-03-05 MED ORDER — GABAPENTIN 300 MG PO CAPS: 300.0000 mg | ORAL_CAPSULE | Freq: Every day | ORAL | 3 refills | Status: DC

## 2019-03-05 NOTE — Progress Notes (Signed)
GYNECOLOGY  VISIT  CC:   Hot flashes and insomnia   HPI: 48 y.o. [id number] Married White or Caucasian female here for insomnia, hot flashes and weight gain x 1 year.  She really wants to be on estrogen but has hx of antiphospholipid antibody testing with her first pregnancy when her daughter had an intrauterine stroke.  Pt reports she's had additional testing later with Dr. Beryle Beams that was negative for this same antiphospholipid antibody.    She was put on gabapentin by Dr. Jonni Sanger.  Tried 100mg  dosing that did nothing and then tried the 200mg  which seemed to help.  Has taken 300mg  once and this really did help with sleep and hot flashes.    States she doesn't want to be addicted to something (advised this is not addicting) and that she doesn't like taking "all these medications" (gabapentin in her only medication).  Given her hx, I do not feel comfortable starting her on estrogen.  Would be willing to refer to hematology for a second opinion about this.  She does not want to do this right now but will consider.    GYNECOLOGIC HISTORY: Patient's last menstrual period was 10/01/2018. Contraception: PMP Menopausal hormone therapy: none   Patient Active Problem List   Diagnosis Date Noted  . 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 Perimenopausal 10/07/2018  . ADD (attention deficit disorder) 10/07/2018  . Seasonal affective disorder ([place]) 10/07/2018  . Migraine headache 01/19/2016  . Antiphospholipid antibody positive 03/16/2015    Past Medical History:  Diagnosis Date  . 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 Anxiety   . History of anemia    during pregnancy  . Migraine    had previous MRI/MRA-diag with ocular migraines  . Seasonal affective disorder ([place]) 10/07/2018   Tried lexapro and one other SSRI: depressed libido and couldn't sleep    Past Surgical History:  Procedure Laterality Date  . CESAREAN SECTION     x2  .  TONSILLECTOMY    . WISDOM TOOTH EXTRACTION      MEDS:   Current Outpatient Medications on File Prior to Visit  Medication Sig Dispense Refill  . gabapentin (NEURONTIN) 100 MG capsule TAKE 1-3 CAPSULES BY MOUTH AT BEDTIME AS NEEDED 90 capsule 0   No current facility-administered medications on file prior to visit.     ALLERGIES: Patient has no known allergies.  Family History  Problem Relation Age of Onset  . Endometriosis Mother        hysterectomy age 67  . Lymphoma Mother   . Diabetes Maternal Grandmother   . Colon cancer Maternal Grandmother   . Hypertension Maternal Grandmother   . Stroke Father   . Basal cell carcinoma Father        skin  . Stroke Daughter        in utero  . Learning disabilities Daughter   . ADD / ADHD Daughter     SH:  Married, non smoker  Review of Systems  Constitutional: Positive for unexpected weight change.  Endocrine: Positive for cold intolerance and heat intolerance.  Psychiatric/Behavioral: Positive for sleep disturbance.  All other systems reviewed and are negative.   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 Rx for Gabapentin 300mg  nightly to pharmacy.  #90/3RF.  Feel she will likely be able to lower this dosage after 6-12 months of use as hot flashes do typically improve.  Pt aware of this.    ~15 minutes spent with patient >50% of time was in face to face discussion of above.

## 2019-04-15 ENCOUNTER — Ambulatory Visit
Admission: RE | Admit: 2019-04-15 | Discharge: 2019-04-15 | Disposition: A | Discharge status: Home / Self Care | Payer: 59 | Source: Ambulatory Visit | Attending: Obstetrics & Gynecology | Admitting: Obstetrics & Gynecology

## 2019-04-15 ENCOUNTER — Other Ambulatory Visit: Payer: Self-pay

## 2019-04-15 DIAGNOSIS — Z1231 Encounter for screening mammogram for malignant neoplasm of breast: Secondary | ICD-10-CM

## 2019-06-03 ENCOUNTER — Other Ambulatory Visit: Payer: Self-pay

## 2019-06-03 ENCOUNTER — Encounter: Payer: Self-pay | Admitting: Family Medicine

## 2019-06-03 ENCOUNTER — Ambulatory Visit (INDEPENDENT_AMBULATORY_CARE_PROVIDER_SITE_OTHER): Payer: 59 | Admitting: Family Medicine

## 2019-06-03 VITALS — BP 102/67 | HR 84 | Temp 97.9°F | Resp 14 | Ht 63.0 in | Wt 119.4 lb

## 2019-06-03 DIAGNOSIS — F902 Attention-deficit hyperactivity disorder, combined type: Secondary | ICD-10-CM

## 2019-06-03 DIAGNOSIS — K120 Recurrent oral aphthae: Secondary | ICD-10-CM

## 2019-06-03 MED ORDER — AMPHETAMINE-DEXTROAMPHET ER 5 MG PO CP24: 5.0000 mg | ORAL_CAPSULE | Freq: Every day | ORAL | 0 refills | Status: DC

## 2019-06-03 NOTE — Progress Notes (Signed)
Subjective  CC:  Chief Complaint  Patient presents with  . Sores In Mouth    Inside of lips, thinks possible reaction to Coke and pecans    HPI: Karen Lozano is a 48 y.o. female who presents to the office today to address the problems listed above in the chief complaint.  Gets canker sores; now with 3 in her mouth that won't heal: 1.5 weeks. Some pain but PO intake is unaffected. No associated sxs. No f/c/s or URI sxs. Came on after drinking a coke so she questions coke allergy.however, she has drank cokes for years. Feels fine otherwise.  Hot flushes: improved on gabapentin 300 qhs. Sleeping much better. Since sleeping better, no more mood issues so stopped the antidepressant and doing well.   ADD: uses [facility] to help her focus. Would reconsider meds now that she is sleeping better.    Assessment  1. Oral aphthous ulcer   2. Attention deficit hyperactivity disorder (ADHD), combined type      Plan   Oral ulcers:  Doubt allergy. Start mvi, vit C, anbesol gel and advil. Reassured.   ADD: education and counseling given. Start low dose adderall. Recheck in 4 weeks.   Hot flushes improved on gabapentin.   Follow up: Return in about 4 weeks (around 07/01/2019) for complete physical, follow up on ADD.  Visit date not found  No orders of the defined types were placed in this encounter.  Meds ordered this encounter  Medications  . amphetamine-dextroamphetamine (ADDERALL XR) 5 MG 24 hr capsule    Sig: Take 1 capsule (5 mg total) by mouth daily.    Dispense:  30 capsule    Refill:  0      I reviewed the patients updated PMH, FH, and SocHx.    Patient Active Problem List   Diagnosis Date Noted  . 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 Perimenopausal 10/07/2018  . ADD (attention deficit disorder) 10/07/2018  . Seasonal affective disorder ([place]) 10/07/2018  . Migraine headache 01/19/2016  . 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  gabapentin (NEURONTIN) 300 MG capsule Take 1 capsule (300 mg total) by mouth at bedtime.    Allergies: Patient has No Known Allergies. Family History: Patient family history includes ADD / ADHD in her daughter; Basal cell carcinoma in her father; Colon cancer in her maternal grandmother; Diabetes in her maternal grandmother; Endometriosis in her mother; Hypertension in her maternal grandmother; Learning disabilities in her daughter; Lymphoma in her mother; Stroke in her daughter and father. Social History:  Patient  reports that she has never smoked. She has never used smokeless tobacco. She reports current alcohol use of about 2.0 standard drinks of alcohol per week. She reports that she does not use drugs.  Review of Systems: Constitutional: Negative for fever malaise or anorexia Cardiovascular: negative for chest pain Respiratory: negative for SOB or persistent cough Gastrointestinal: negative for abdominal pain  Objective  Vitals: BP 102/67   Pulse 84   Temp 97.9 F (36.6 C) (Tympanic)   Resp 14   Ht 5\' 3"  (1.6 m)   Wt 119 lb 6.4 oz (54.2 kg)   LMP 10/01/2018   SpO2 98%   BMI 21.15 kg/m  General: no acute distress , A&Ox3 HEENT: PEERL, conjunctiva normal, Oropharynx moist,neck is supple w/o LAD. Inner lower lip with 2 ulcers, one small ulcer beneath tongue      Commons side effects, risks, benefits, and alternatives for medications and treatment  plan prescribed today were discussed, and the patient expressed understanding of the given instructions. Patient is instructed to call or message via MyChart if he/she has any questions or concerns regarding our treatment plan. No barriers to understanding were identified. We discussed Red Flag symptoms and signs in detail. Patient expressed understanding regarding what to do in case of urgent or emergency type symptoms.   Medication list was reconciled, printed and provided to the patient in AVS. Patient instructions and summary  information was reviewed with the patient as documented in the AVS. This note was prepared with assistance of Dragon voice recognition software. Occasional wrong-word or sound-a-like substitutions may have occurred due to the inherent limitations of voice recognition software

## 2019-06-03 NOTE — Patient Instructions (Signed)
Please return in 4 weeks for your annual complete physical; please come fasting and recheck ADD  Use anbesol gel and advil. Start a multivitamin. Consider adding lysine if they won't improve.   If you have any questions or concerns, please don't hesitate to send me a message via MyChart or call the office at [phone]. Thank you for visiting with [place] today! It's our pleasure caring for you.

## 2019-07-01 ENCOUNTER — Encounter: Payer: 59 | Admitting: Family Medicine

## 2019-07-06 ENCOUNTER — Encounter: Payer: Self-pay | Admitting: Family Medicine

## 2019-07-06 ENCOUNTER — Other Ambulatory Visit: Payer: Self-pay

## 2019-07-06 ENCOUNTER — Ambulatory Visit (INDEPENDENT_AMBULATORY_CARE_PROVIDER_SITE_OTHER): Payer: 59 | Admitting: Family Medicine

## 2019-07-06 VITALS — BP 102/60 | HR 68 | Temp 97.8°F | Resp 16 | Ht 63.0 in | Wt 116.6 lb

## 2019-07-06 DIAGNOSIS — R350 Frequency of micturition: Secondary | ICD-10-CM | POA: Diagnosis not present

## 2019-07-06 LAB — POCT URINALYSIS DIPSTICK
Bilirubin, UA: NEGATIVE
Blood, UA: NEGATIVE
Glucose, UA: NEGATIVE
Ketones, UA: NEGATIVE
Nitrite, UA: NEGATIVE
Protein, UA: NEGATIVE
Spec Grav, UA: 1.015 (ref 1.010–1.025)
Urobilinogen, UA: 1 E.U./dL
pH, UA: 6 (ref 5.0–8.0)

## 2019-07-06 MED ORDER — NITROFURANTOIN MONOHYD MACRO 100 MG PO CAPS: 100.0000 mg | ORAL_CAPSULE | Freq: Two times a day (BID) | ORAL | 0 refills | Status: DC

## 2019-07-06 NOTE — Patient Instructions (Signed)

## 2019-07-06 NOTE — Progress Notes (Signed)
  Subjective:     Patient ID: Karen Lozano, female   DOB: 01-07-1971, 48 y.o.   MRN: [id number]  HPI   Karen Lozano is seen with dysuria.  Symptoms started yesterday.  She has had some urine frequency and burning with urination.  No gross hematuria.  No fevers or chills.  No flank pain.  History of frequent UTIs in the past.  She states she was most recently treated about 6 months ago.  She has tolerated Macrobid as well as Keflex in the past without difficulty.  Past Medical History:  Diagnosis Date  . 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 Anxiety   . History of anemia    during pregnancy  . Migraine    had previous MRI/MRA-diag with ocular migraines  . Seasonal affective disorder ([place]) 10/07/2018   Tried lexapro and one other SSRI: depressed libido and couldn't sleep   Past Surgical History:  Procedure Laterality Date  . CESAREAN SECTION     x2  . TONSILLECTOMY    . WISDOM TOOTH EXTRACTION      reports that she has never smoked. She has never used smokeless tobacco. She reports current alcohol use of about 2.0 standard drinks of alcohol per week. She reports that she does not use drugs. family history includes ADD / ADHD in her daughter; Basal cell carcinoma in her father; Colon cancer in her maternal grandmother; Diabetes in her maternal grandmother; Endometriosis in her mother; Hypertension in her maternal grandmother; Learning disabilities in her daughter; Lymphoma in her mother; Stroke in her daughter and father. No Known Allergies   Review of Systems  Constitutional: Negative for chills and fever.  Gastrointestinal: Negative for nausea and vomiting.  Genitourinary: Positive for dysuria. Negative for flank pain and hematuria.       Objective:   Physical Exam Vitals signs reviewed.  Constitutional:      Appearance: Normal appearance.  Neurological:     Mental Status: She is alert.        Assessment:      Dysuria.  Suspect uncomplicated cystitis.    Plan:     -Urine culture sent -Start Macrobid 1 twice daily for 5 days -Drink plenty of fluids -Follow-up with primary if symptoms not resolving on antibiotics  Eulas Post MD [facility]

## 2019-07-08 LAB — URINE CULTURE
MICRO NUMBER:: 1036662
SPECIMEN QUALITY:: ADEQUATE

## 2019-07-16 ENCOUNTER — Encounter: Payer: Self-pay | Admitting: Family Medicine

## 2019-07-16 ENCOUNTER — Ambulatory Visit (INDEPENDENT_AMBULATORY_CARE_PROVIDER_SITE_OTHER): Payer: 59 | Admitting: Family Medicine

## 2019-07-16 ENCOUNTER — Other Ambulatory Visit: Payer: Self-pay

## 2019-07-16 VITALS — BP 108/62 | HR 77 | Temp 98.0°F | Ht 63.0 in | Wt 115.2 lb

## 2019-07-16 DIAGNOSIS — N951 Menopausal and female climacteric states: Secondary | ICD-10-CM | POA: Diagnosis not present

## 2019-07-16 DIAGNOSIS — Z0000 Encounter for general adult medical examination without abnormal findings: Secondary | ICD-10-CM | POA: Diagnosis not present

## 2019-07-16 DIAGNOSIS — R760 Raised antibody titer: Secondary | ICD-10-CM

## 2019-07-16 DIAGNOSIS — F902 Attention-deficit hyperactivity disorder, combined type: Secondary | ICD-10-CM | POA: Diagnosis not present

## 2019-07-16 LAB — COMPREHENSIVE METABOLIC PANEL
ALT: 14 U/L (ref 0–35)
AST: 19 U/L (ref 0–37)
Albumin: 4.4 g/dL (ref 3.5–5.2)
Alkaline Phosphatase: 37 U/L — ABNORMAL LOW (ref 39–117)
BUN: 17 mg/dL (ref 6–23)
CO2: 32 mEq/L (ref 19–32)
Calcium: 10 mg/dL (ref 8.4–10.5)
Chloride: 102 mEq/L (ref 96–112)
Creatinine, Ser: 0.8 mg/dL (ref 0.40–1.20)
GFR: 76.19 mL/min (ref >60.00)
Glucose, Bld: 97 mg/dL (ref 70–99)
Potassium: 5.4 mEq/L — ABNORMAL HIGH (ref 3.5–5.1)
Sodium: 140 mEq/L (ref 135–145)
Total Bilirubin: 0.6 mg/dL (ref 0.2–1.2)
Total Protein: 6.7 g/dL (ref 6.0–8.3)

## 2019-07-16 LAB — LIPID PANEL
Cholesterol: 217 mg/dL — ABNORMAL HIGH (ref 0–200)
HDL: 59.2 mg/dL (ref >39.00)
LDL Cholesterol: 144 mg/dL — ABNORMAL HIGH (ref 0–99)
NonHDL: 157.67
Total CHOL/HDL Ratio: 4
Triglycerides: 69 mg/dL (ref 0.0–149.0)
VLDL: 13.8 mg/dL (ref 0.0–40.0)

## 2019-07-16 LAB — CBC WITH DIFFERENTIAL/PLATELET
Basophils Absolute: 0.1 10*3/uL (ref 0.0–0.1)
Basophils Relative: 2.3 % (ref 0.0–3.0)
Eosinophils Absolute: 0.1 10*3/uL (ref 0.0–0.7)
Eosinophils Relative: 3.2 % (ref 0.0–5.0)
HCT: 39.4 % (ref 36.0–46.0)
Hemoglobin: 13.2 g/dL (ref 12.0–15.0)
Lymphocytes Relative: 27.2 % (ref 12.0–46.0)
Lymphs Abs: 1 10*3/uL (ref 0.7–4.0)
MCHC: 33.5 g/dL (ref 30.0–36.0)
MCV: 82.6 fl (ref 78.0–100.0)
Monocytes Absolute: 0.3 10*3/uL (ref 0.1–1.0)
Monocytes Relative: 8.1 % (ref 3.0–12.0)
Neutro Abs: 2.2 10*3/uL (ref 1.4–7.7)
Neutrophils Relative %: 59.2 % (ref 43.0–77.0)
Platelets: 251 10*3/uL (ref 150.0–400.0)
RBC: 4.77 Mil/uL (ref 3.87–5.11)
RDW: 14.1 % (ref 11.5–15.5)
WBC: 3.8 10*3/uL — ABNORMAL LOW (ref 4.0–10.5)

## 2019-07-16 LAB — TSH: TSH: 0.97 u[IU]/mL (ref 0.35–4.50)

## 2019-07-16 NOTE — Patient Instructions (Addendum)
Please return in 12 months for your annual complete physical; please come fasting.  Please sign up for my chart. I've texted you the link. I will release your lab results to you on your MyChart account with further instructions. Please reply with any questions.    If you have any questions or concerns, please don't hesitate to send me a message via MyChart or call the office at [phone]. Thank you for visiting with [place] today! It's our pleasure caring for you.   Preventive Care 74-70 Years Old, Female Preventive care refers to visits with your health care provider and lifestyle choices that can promote health and wellness. This includes:  A yearly physical exam. This may also be called an annual well check.  Regular dental visits and eye exams.  Immunizations.  Screening for certain conditions.  Healthy lifestyle choices, such as eating a healthy diet, getting regular exercise, not using drugs or products that contain nicotine and tobacco, and limiting alcohol use. What can I expect for my preventive care visit? Physical exam Your health care provider will check your:  Height and weight. This may be used to calculate body mass index (BMI), which tells if you are at a healthy weight.  Heart rate and blood pressure.  Skin for abnormal spots. Counseling Your health care provider may ask you questions about your:  Alcohol, tobacco, and drug use.  Emotional well-being.  Home and relationship well-being.  Sexual activity.  Eating habits.  Work and work Statistician.  Method of birth control.  Menstrual cycle.  Pregnancy history. What immunizations do I need?  Influenza (flu) vaccine  This is recommended every year. Tetanus, diphtheria, and pertussis (Tdap) vaccine  You may need a Td booster every 10 years. Varicella (chickenpox) vaccine  You may need this if you have not been vaccinated. Zoster (shingles) vaccine  You may need this after age 67. Measles,  mumps, and rubella (MMR) vaccine  You may need at least one dose of MMR if you were born in 1957 or later. You may also need a second dose. Pneumococcal conjugate (PCV13) vaccine  You may need this if you have certain conditions and were not previously vaccinated. Pneumococcal polysaccharide (PPSV23) vaccine  You may need one or two doses if you smoke cigarettes or if you have certain conditions. Meningococcal conjugate (MenACWY) vaccine  You may need this if you have certain conditions. Hepatitis A vaccine  You may need this if you have certain conditions or if you travel or work in places where you may be exposed to hepatitis A. Hepatitis B vaccine  You may need this if you have certain conditions or if you travel or work in places where you may be exposed to hepatitis B. Haemophilus influenzae type b (Hib) vaccine  You may need this if you have certain conditions. Human papillomavirus (HPV) vaccine  If recommended by your health care provider, you may need three doses over 6 months. You may receive vaccines as individual doses or as more than one vaccine together in one shot (combination vaccines). Talk with your health care provider about the risks and benefits of combination vaccines. What tests do I need? Blood tests  Lipid and cholesterol levels. These may be checked every 5 years, or more frequently if you are over 23 years old.  Hepatitis C test.  Hepatitis B test. Screening  Lung cancer screening. You may have this screening every year starting at age 71 if you have a 30-pack-year history of smoking and currently  smoke or have quit within the past 15 years.  Colorectal cancer screening. All adults should have this screening starting at age 80 and continuing until age 53. Your health care provider may recommend screening at age 37 if you are at increased risk. You will have tests every 1-10 years, depending on your results and the type of screening test.  Diabetes  screening. This is done by checking your blood sugar (glucose) after you have not eaten for a while (fasting). You may have this done every 1-3 years.  Mammogram. This may be done every 1-2 years. Talk with your health care provider about when you should start having regular mammograms. This may depend on whether you have a family history of breast cancer.  BRCA-related cancer screening. This may be done if you have a family history of breast, ovarian, tubal, or peritoneal cancers.  Pelvic exam and Pap test. This may be done every 3 years starting at age 74. Starting at age 79, this may be done every 5 years if you have a Pap test in combination with an HPV test. Other tests  Sexually transmitted disease (STD) testing.  Bone density scan. This is done to screen for osteoporosis. You may have this scan if you are at high risk for osteoporosis. Follow these instructions at home: Eating and drinking  Eat a diet that includes fresh fruits and vegetables, whole grains, lean protein, and low-fat dairy.  Take vitamin and mineral supplements as recommended by your health care provider.  Do not drink alcohol if: ? Your health care provider tells you not to drink. ? You are pregnant, may be pregnant, or are planning to become pregnant.  If you drink alcohol: ? Limit how much you have to 0-1 drink a day. ? Be aware of how much alcohol is in your drink. In the [place] one drink equals one 12 oz bottle of beer (355 mL), one 5 oz glass of wine (148 mL), or one 1 oz glass of hard liquor (44 mL). Lifestyle  Take daily care of your teeth and gums.  Stay active. Exercise for at least 30 minutes on 5 or more days each week.  Do not use any products that contain nicotine or tobacco, such as cigarettes, e-cigarettes, and chewing tobacco. If you need help quitting, ask your health care provider.  If you are sexually active, practice safe sex. Use a condom or other form of birth control (contraception) in  order to prevent pregnancy and STIs (sexually transmitted infections).  If told by your health care provider, take low-dose aspirin daily starting at age 63. What's next?  Visit your health care provider once a year for a well check visit.  Ask your health care provider how often you should have your eyes and teeth checked.  Stay up to date on all vaccines. This information is not intended to replace advice given to you by your health care provider. Make sure you discuss any questions you have with your health care provider. Document Released: 09/22/2015 Document Revised: 05/07/2018 Document Reviewed: 05/07/2018 Elsevier Patient Education  2020 [employer].

## 2019-07-16 NOTE — Progress Notes (Signed)
Subjective  Chief Complaint  Patient presents with  . Annual Exam    HPI: Karen Lozano is a 48 y.o. female who presents to [facility] [place] today for a Female Wellness Visit. She also has the concerns and/or needs as listed above in the chief complaint. These will be addressed in addition to the Health Maintenance Visit.   Wellness Visit: annual visit with health maintenance review and exam without Pap   HM: sees gyn. Screens are up to date. Doing well. Having hot flushes. On gabapentin.  Chronic disease f/u and/or acute problem visit: (deemed necessary to be done in addition to the wellness visit):  ADD: trial of adderall: caused insomnia and possible worsened hot flushes. Has failed other stimulants. Not interested in trial of non-stimulants given sensitivities to meds. Prefers to defer tx at this time. Feels it is the correct dx although she is also anxious.   perimenopausal sxs: managed by gyn. Estrogens are CI.  Assessment  1. Annual physical exam   2. Attention deficit hyperactivity disorder (ADHD), combined type   3. Antiphospholipid antibody positive   4. Perimenopausal      Plan  Female Wellness Visit:  Age appropriate Health Maintenance and Prevention measures were discussed with patient. Included topics are cancer screening recommendations, ways to keep healthy (see AVS) including dietary and exercise recommendations, regular eye and dental care, use of seat belts, and avoidance of moderate alcohol use and tobacco use.   BMI: discussed patient's BMI and encouraged positive lifestyle modifications to help get to or maintain a target BMI.  HM needs and immunizations were addressed and ordered. See below for orders. See HM and immunization section for updates. utd  Routine labs and screening tests ordered including cmp, cbc and lipids where appropriate.  Discussed recommendations regarding Vit D and calcium supplementation (see AVS)  Chronic disease  management visit and/or acute problem visit:  ADD: behavioral mgt for now.   Hot flushes per gyn.  Follow up: Return in about 1 year (around 07/15/2020) for complete physical.  Orders Placed This Encounter  Procedures  . CBC w/Diff  . CMP  . Lipids  . TSH   No orders of the defined types were placed in this encounter.     Lifestyle: Body mass index is 20.41 kg/m. Wt Readings from Last 3 Encounters:  07/16/19 115 lb 3.2 oz (52.3 kg)  07/06/19 116 lb 9.6 oz (52.9 kg)  06/03/19 119 lb 6.4 oz (54.2 kg)    Patient Active Problem List   Diagnosis Date Noted  . 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 Perimenopausal 10/07/2018    HRT is contraindicated   . ADD (attention deficit disorder) 10/07/2018    [facility] evaluation 2017: started meds but interfered with sleep. Adderall, ritalin. Trial of stratterra initiated 09/2018; consider intuniv as well    . Seasonal affective disorder ([place]) 10/07/2018    Tried lexapro and one other SSRI: depressed libido and couldn't sleep   . Migraine headache 01/19/2016  . Antiphospholipid antibody positive 03/16/2015   Health Maintenance  Topic Date Due  . PAP SMEAR-Modifier  05/01/2023  . TETANUS/TDAP  09/09/2024  . INFLUENZA VACCINE  Completed  . HIV Screening  Completed    There is no immunization history on file for this patient. We updated and reviewed the patient's past history in detail and it is documented below. Allergies: Patient has No Known Allergies. Past Medical History Patient  has a past medical history of Anticardiolipin antibody  positive, Anxiety, History of anemia, Migraine, and Seasonal affective disorder ([place]) (10/07/2018). Past Surgical History Patient  has a past surgical history that includes Cesarean section; Tonsillectomy; and Wisdom tooth extraction. Family History: Patient family history includes ADD / ADHD in her daughter; Basal cell carcinoma in her father; Colon cancer in her maternal  grandmother; Diabetes in her maternal grandmother; Endometriosis in her mother; Hypertension in her maternal grandmother; Learning disabilities in her daughter; Lymphoma in her mother; Stroke in her daughter and father. Social History:  Patient  reports that she has never smoked. She has never used smokeless tobacco. She reports current alcohol use of about 2.0 standard drinks of alcohol per week. She reports that she does not use drugs.  Review of Systems: Constitutional: negative for fever or malaise Ophthalmic: negative for photophobia, double vision or loss of vision Cardiovascular: negative for chest pain, dyspnea on exertion, or new LE swelling Respiratory: negative for SOB or persistent cough Gastrointestinal: negative for abdominal pain, change in bowel habits or melena Genitourinary: negative for dysuria or gross hematuria, no abnormal uterine bleeding or disharge Musculoskeletal: negative for new gait disturbance or muscular weakness Integumentary: negative for new or persistent rashes, no breast lumps Neurological: negative for TIA or stroke symptoms Psychiatric: negative for SI or delusions Allergic/Immunologic: negative for hives  Patient Care Team    Relationship Specialty Notifications Start End  Leamon Arnt, MD PCP - General Family Medicine  10/07/18   Megan Salon, MD Consulting Physician Gynecology  10/07/18     Objective  Vitals: BP 108/62   Pulse 77   Temp 98 F (36.7 C) (Temporal)   Ht 5\' 3"  (1.6 m)   Wt 115 lb 3.2 oz (52.3 kg)   LMP 10/01/2018   SpO2 98%   BMI 20.41 kg/m  General:  Well developed, well nourished, no acute distress  Psych:  Alert and orientedx3,normal mood and affect HEENT:  Normocephalic, atraumatic, non-icteric sclera, PERRL, oropharynx is clear without mass or exudate, supple neck without adenopathy, mass or thyromegaly Cardiovascular:  Normal S1, S2, RRR without gallop, rub or murmur, nondisplaced PMI Respiratory:  Good breath sounds  bilaterally, CTAB with normal respiratory effort Gastrointestinal: normal bowel sounds, soft, non-tender, no noted masses. No HSM MSK: no deformities, contusions. Joints are without erythema or swelling. Spine and CVA region are nontender Skin:  Warm, no rashes or suspicious lesions noted Neurologic:    Mental status is normal. CN 2-11 are normal. Gross motor and sensory exams are normal. Normal gait. No tremor     Commons side effects, risks, benefits, and alternatives for medications and treatment plan prescribed today were discussed, and the patient expressed understanding of the given instructions. Patient is instructed to call or message via MyChart if he/she has any questions or concerns regarding our treatment plan. No barriers to understanding were identified. We discussed Red Flag symptoms and signs in detail. Patient expressed understanding regarding what to do in case of urgent or emergency type symptoms.   Medication list was reconciled, printed and provided to the patient in AVS. Patient instructions and summary information was reviewed with the patient as documented in the AVS. This note was prepared with assistance of Dragon voice recognition software. Occasional wrong-word or sound-a-like substitutions may have occurred due to the inherent limitations of voice recognition software

## 2019-07-16 NOTE — Progress Notes (Signed)
Please call patient: I have reviewed his/her lab results. Lab work shows an elevated potassium. Is she taking any otc supplements with potassium? I recommend repeating a bmp on Monday to confirm; can get at her work place or return here for lab visit. Other labs are normal.

## 2019-07-20 ENCOUNTER — Telehealth: Payer: Self-pay | Admitting: Family Medicine

## 2019-07-20 NOTE — Telephone Encounter (Signed)
See note  Copied from [place] [id number]. Topic: General - Other >> Jul 20, 2019  2:19 PM Percell Belt A wrote: Reason for CRM: pt called [facility] Penny back regarding her labs.  Please advise. Best contact # [phone]

## 2019-07-21 ENCOUNTER — Other Ambulatory Visit: Payer: Self-pay | Admitting: *Deleted

## 2019-07-21 DIAGNOSIS — E875 Hyperkalemia: Secondary | ICD-10-CM

## 2019-07-21 NOTE — Telephone Encounter (Signed)
See result notes. 

## 2019-07-26 ENCOUNTER — Other Ambulatory Visit: Payer: 59

## 2019-07-27 ENCOUNTER — Other Ambulatory Visit: Payer: Self-pay

## 2019-07-27 ENCOUNTER — Other Ambulatory Visit (INDEPENDENT_AMBULATORY_CARE_PROVIDER_SITE_OTHER): Payer: 59

## 2019-07-27 DIAGNOSIS — E875 Hyperkalemia: Secondary | ICD-10-CM | POA: Diagnosis not present

## 2019-07-27 LAB — BASIC METABOLIC PANEL
BUN: 13 mg/dL (ref 6–23)
CO2: 30 mEq/L (ref 19–32)
Calcium: 9.1 mg/dL (ref 8.4–10.5)
Chloride: 103 mEq/L (ref 96–112)
Creatinine, Ser: 0.72 mg/dL (ref 0.40–1.20)
GFR: 86.03 mL/min (ref >60.00)
Glucose, Bld: 118 mg/dL — ABNORMAL HIGH (ref 70–99)
Potassium: 4.2 mEq/L (ref 3.5–5.1)
Sodium: 139 mEq/L (ref 135–145)

## 2019-07-27 NOTE — Progress Notes (Signed)
Please call patient: I have reviewed his/her lab results. Let her know the potassium is now normal. Likely was a lab error.

## 2019-08-12 NOTE — Progress Notes (Deleted)
48 y.o. [id number] Married White or Caucasian female here for annual exam.    Patient's last menstrual period was 10/01/2018.          Sexually active: {yes no:314532}  The current method of family planning is {contraception:315051}.    Exercising: {yes no:314532}  {types:19826} Smoker:  {YES V2345720  Health Maintenance: Pap:  04/30/18 Normal  History of abnormal Pap:  {YES NO:22349} MMG:  04/15/2019 birads 1 neg  Colonoscopy:  *** TDaP: 2016 Hep C testing: done  Screening Labs: ***   reports that she has never smoked. She has never used smokeless tobacco. She reports current alcohol use of about 2.0 standard drinks of alcohol per week. She reports that she does not use drugs.  Past Medical History:  Diagnosis Date  . 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 Anxiety   . History of anemia    during pregnancy  . Migraine    had previous MRI/MRA-diag with ocular migraines  . Seasonal affective disorder ([place]) 10/07/2018   Tried lexapro and one other SSRI: depressed libido and couldn't sleep    Past Surgical History:  Procedure Laterality Date  . CESAREAN SECTION     x2  . TONSILLECTOMY    . WISDOM TOOTH EXTRACTION      Current Outpatient Medications  Medication Sig Dispense Refill  . gabapentin (NEURONTIN) 300 MG capsule Take 1 capsule (300 mg total) by mouth at bedtime. 90 capsule 3  . nitrofurantoin, macrocrystal-monohydrate, (MACROBID) 100 MG capsule Take 1 capsule (100 mg total) by mouth 2 (two) times daily. 10 capsule 0   No current facility-administered medications for this visit.     Family History  Problem Relation Age of Onset  . Endometriosis Mother        hysterectomy age 40  . Lymphoma Mother   . Diabetes Maternal Grandmother   . Colon cancer Maternal Grandmother   . Hypertension Maternal Grandmother   . Stroke Father   . Basal cell carcinoma Father        skin  . Stroke Daughter        in  utero  . Learning disabilities Daughter   . ADD / ADHD Daughter     Review of Systems  Exam:   LMP 10/01/2018   Height:      Ht Readings from Last 3 Encounters:  07/16/19 5\' 3"  (1.6 m)  07/06/19 5\' 3"  (1.6 m)  06/03/19 5\' 3"  (1.6 m)    General appearance: alert, cooperative and appears stated age Head: Normocephalic, without obvious abnormality, atraumatic Neck: no adenopathy, supple, symmetrical, trachea midline and thyroid {EXAM; THYROID:18604} Lungs: clear to auscultation bilaterally Breasts: {Exam; breast:13139::"normal appearance, no masses or tenderness"} Heart: regular rate and rhythm Abdomen: soft, non-tender; bowel sounds normal; no masses,  no organomegaly Extremities: extremities normal, atraumatic, no cyanosis or edema Skin: Skin color, texture, turgor normal. No rashes or lesions Lymph nodes: Cervical, supraclavicular, and axillary nodes normal. No abnormal inguinal nodes palpated Neurologic: Grossly normal   Pelvic: External genitalia:  no lesions              Urethra:  normal appearing urethra with no masses, tenderness or lesions              Bartholins and Skenes: normal                 Vagina: normal appearing vagina with normal color and discharge, no lesions  Cervix: {exam; cervix:14595}              Pap taken: {yes no:314532} Bimanual Exam:  Uterus:  {exam; uterus:12215}              Adnexa: {exam; adnexa:12223}               Rectovaginal: Confirms               Anus:  normal sphincter tone, no lesions  Chaperone was present for exam.  A:  Well Woman with normal exam  P:   {plan; gyn:5269::"mammogram","pap smear","return annually or prn"}

## 2019-08-20 ENCOUNTER — Ambulatory Visit: Payer: 59 | Admitting: Obstetrics & Gynecology

## 2019-10-08 ENCOUNTER — Other Ambulatory Visit: Payer: Self-pay

## 2019-10-12 ENCOUNTER — Encounter: Payer: Self-pay | Admitting: Obstetrics & Gynecology

## 2019-10-12 ENCOUNTER — Ambulatory Visit (INDEPENDENT_AMBULATORY_CARE_PROVIDER_SITE_OTHER): Payer: 59 | Admitting: Obstetrics & Gynecology

## 2019-10-12 ENCOUNTER — Other Ambulatory Visit: Payer: Self-pay

## 2019-10-12 VITALS — BP 102/60 | HR 68 | Temp 96.8°F | Resp 10 | Ht 62.75 in | Wt 120.6 lb

## 2019-10-12 DIAGNOSIS — Z01419 Encounter for gynecological examination (general) (routine) without abnormal findings: Secondary | ICD-10-CM | POA: Diagnosis not present

## 2019-10-12 MED ORDER — GABAPENTIN 100 MG PO CAPS: 300.0000 mg | ORAL_CAPSULE | Freq: Every day | ORAL | 3 refills | Status: DC

## 2019-10-12 MED ORDER — NITROFURANTOIN MONOHYD MACRO 100 MG PO CAPS: 100.0000 mg | ORAL_CAPSULE | Freq: Two times a day (BID) | ORAL | 0 refills | Status: DC

## 2019-10-12 NOTE — Progress Notes (Signed)
49 y.o. [id number] Married White or Caucasian female here for annual exam.  Cycles are irregular.  Last cycle was in September.  Was 6 months prior to that with last cycle.     Hot flashes are better.  Using gabapentin much less frequently.  Would like dosage decreased so she can use less if possible.         Sexually active: Yes.    The current method of family planning is vasectomy.    Exercising: Yes.    2-3 times a week Smoker:  no  Health Maintenance: Pap:   04/30/18 Neg:Neg HR HPV  03/16/15 Neg. HR HPV:neg  History of abnormal Pap:  no MMG:  04/15/19 BIRADS 1 negative/density c Colonoscopy:  None TDaP:  2016 Pneumonia vaccine(s):  n/a Shingrix:   no Hep C testing: n/a Screening Labs: PCP   reports that she has never smoked. She has never used smokeless tobacco. She reports current alcohol use of about 2.0 standard drinks of alcohol per week. She reports that she does not use drugs.  Past Medical History:  Diagnosis Date  . 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 Anxiety   . History of anemia    during pregnancy  . Migraine    had previous MRI/MRA-diag with ocular migraines  . Seasonal affective disorder ([place]) 10/07/2018   Tried lexapro and one other SSRI: depressed libido and couldn't sleep    Past Surgical History:  Procedure Laterality Date  . CESAREAN SECTION     x2  . TONSILLECTOMY    . WISDOM TOOTH EXTRACTION      Current Outpatient Medications  Medication Sig Dispense Refill  . gabapentin (NEURONTIN) 300 MG capsule Take 1 capsule (300 mg total) by mouth at bedtime. 90 capsule 3  . nitrofurantoin, macrocrystal-monohydrate, (MACROBID) 100 MG capsule Take 1 capsule (100 mg total) by mouth 2 (two) times daily. (Patient not taking: Reported on 10/12/2019) 10 capsule 0   No current facility-administered medications for this visit.    Family History  Problem Relation Age of Onset  . Endometriosis Mother        hysterectomy age 43  . Lymphoma Mother   . Diabetes Maternal Grandmother   . Colon cancer Maternal Grandmother   . Hypertension Maternal Grandmother   . Stroke Father   . Basal cell carcinoma Father        skin  . Stroke Daughter        in utero  . Learning disabilities Daughter   . ADD / ADHD Daughter     Review of Systems  All other systems reviewed and are negative.   Exam:   BP 102/60 (BP Location: Right Arm, Patient Position: Sitting, Cuff Size: Normal)   Pulse 68   Temp (!) 96.8 F (36 C) (Temporal)   Resp 10   Ht 5' 2.75" (1.594 m)   Wt 120 lb 9.6 oz (54.7 kg)   LMP 10/01/2018   BMI 21.53 kg/m     Height: 5' 2.75" (159.4 cm)  Ht Readings from Last 3 Encounters:  10/12/19 5' 2.75" (1.594 m)  07/16/19 5\' 3"  (1.6 m)  07/06/19 5\' 3"  (1.6 m)    General appearance: alert, cooperative and appears stated age Head: Normocephalic, without obvious abnormality, atraumatic Neck: no adenopathy, supple, symmetrical, trachea midline and thyroid normal to inspection and palpation Lungs: clear to auscultation bilaterally Breasts: normal appearance, no masses or tenderness Heart: regular rate and rhythm  Abdomen: soft, non-tender; bowel sounds normal; no masses,  no organomegaly Extremities: extremities normal, atraumatic, no cyanosis or edema Skin: Skin color, texture, turgor normal. No rashes or lesions Lymph nodes: Cervical, supraclavicular, and axillary nodes normal. No abnormal inguinal nodes palpated Neurologic: Grossly normal   Pelvic: External genitalia:  no lesions              Urethra:  normal appearing urethra with no masses, tenderness or lesions              Bartholins and Skenes: normal                 Vagina: normal appearing vagina with normal color and discharge, no lesions              Cervix: no lesions              Pap taken: No. Bimanual Exam:  Uterus:  normal size, contour, position, consistency, mobility, non-tender              Adnexa: normal  adnexa and no mass, fullness, tenderness               Rectovaginal: Confirms               Anus:  normal sphincter tone, no lesions  Chaperone, Terence Lux, CMA, was present for exam.  A:  Well Woman with normal exam Menopausal symptoms, improved hot flashes this past year H/o UTIs H/o antiphospholipid antibodies and daughter had intrauterine stroke Ocular migraines Gamily hx of colon cancer in [place], diagnosed in her 79's  P:   Mammogram guidelines reviewed pap smear with neg HR HPV 2019.  Not indicated today. Colonoscopy declines.  Cologuard will be ordered after age 79. Blood work done 07/2019 Rx for gabapentin 100mg , take up to 3 nightly as needed for hot flashes.  #90/3RF. RF for macrobid 100mg  bid for 5 days to pharmacy.  #10/0RF Blood work done 07/2019 and reviewed with pt Return annually or prn

## 2019-10-14 ENCOUNTER — Ambulatory Visit: Payer: 59 | Admitting: Obstetrics & Gynecology

## 2020-05-16 ENCOUNTER — Encounter: Payer: Self-pay | Admitting: Obstetrics & Gynecology

## 2020-06-14 ENCOUNTER — Other Ambulatory Visit: Payer: Self-pay | Admitting: Obstetrics & Gynecology

## 2020-06-14 DIAGNOSIS — Z1231 Encounter for screening mammogram for malignant neoplasm of breast: Secondary | ICD-10-CM

## 2020-07-11 ENCOUNTER — Ambulatory Visit
Admission: RE | Admit: 2020-07-11 | Discharge: 2020-07-11 | Disposition: A | Discharge status: Home / Self Care | Payer: 59 | Source: Ambulatory Visit | Attending: Obstetrics & Gynecology | Admitting: Obstetrics & Gynecology

## 2020-07-11 ENCOUNTER — Other Ambulatory Visit: Payer: Self-pay

## 2020-07-11 DIAGNOSIS — Z1231 Encounter for screening mammogram for malignant neoplasm of breast: Secondary | ICD-10-CM

## 2020-07-26 ENCOUNTER — Encounter: Payer: Self-pay | Admitting: Family Medicine

## 2020-07-27 ENCOUNTER — Telehealth (INDEPENDENT_AMBULATORY_CARE_PROVIDER_SITE_OTHER): Payer: 59 | Admitting: Family Medicine

## 2020-07-27 ENCOUNTER — Encounter: Payer: 59 | Admitting: Family Medicine

## 2020-07-27 DIAGNOSIS — R112 Nausea with vomiting, unspecified: Secondary | ICD-10-CM | POA: Diagnosis not present

## 2020-07-27 DIAGNOSIS — R109 Unspecified abdominal pain: Secondary | ICD-10-CM | POA: Diagnosis not present

## 2020-07-27 DIAGNOSIS — R300 Dysuria: Secondary | ICD-10-CM

## 2020-07-27 DIAGNOSIS — R319 Hematuria, unspecified: Secondary | ICD-10-CM

## 2020-07-27 NOTE — Patient Instructions (Signed)
Seek in person care today as we discussed for further evaluation.   I hope you are feeling better soon!   It was nice to meet you today. I help [facility] out with telemedicine visits on Tuesdays and Thursdays and am available for visits on those days. If you have any concerns or questions following this visit please schedule a follow up visit with your Primary Care doctor.

## 2020-07-27 NOTE — Progress Notes (Signed)
Virtual Visit via Video Note  I connected with Karen Lozano  on 07/27/20 at  6:00 PM EST by a video enabled telemedicine application and verified that I am speaking with the correct person using two identifiers.  Location patient: home, [place] Location provider:work or home office Persons participating in the virtual visit: patient, provider   HPI:  Acute telemedicine visit for Dysuria: -Onset: today -had UTI 3 weeks ago and took Bactrim for 5 days from her gyn which cleared it up, reports has frequent UTIs which have worsened recently -Symptoms include: sudden onset nausea, vomiting x 1 this morning, chills, bad flank pain, gross hematuria, frequency, urgency, dysuria, poor appetite -Denies: fever that she is aware of, diarrhea, hx of kidney stones -Has tried:azo and zofran today -Pertinent past medical history: UTIs, reports usually treated with her gyn   ROS: See pertinent positives and negatives per HPI.  Past Medical History:  Diagnosis Date  . 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 Anxiety   . History of anemia    during pregnancy  . Migraine    had previous MRI/MRA-diag with ocular migraines  . Seasonal affective disorder ([place]) 10/07/2018   Tried lexapro and one other SSRI: depressed libido and couldn't sleep    Past Surgical History:  Procedure Laterality Date  . CESAREAN SECTION     x2  . TONSILLECTOMY    . WISDOM TOOTH EXTRACTION       Current Outpatient Medications:  .  gabapentin (NEURONTIN) 100 MG capsule, Take 3 capsules (300 mg total) by mouth at bedtime., Disp: 90 capsule, Rfl: 3 .  nitrofurantoin, macrocrystal-monohydrate, (MACROBID) 100 MG capsule, Take 1 capsule (100 mg total) by mouth 2 (two) times daily., Disp: 10 capsule, Rfl: 0  EXAM:  VITALS per patient if applicable:  GENERAL: alert, oriented, appears well and in no acute distress  HEENT: atraumatic, conjunttiva clear, no obvious  abnormalities on inspection of external nose and ears  NECK: normal movements of the head and neck  LUNGS: on inspection no signs of respiratory distress, breathing rate appears normal, no obvious gross SOB, gasping or wheezing  CV: no obvious cyanosis  MS: moves all visible extremities without noticeable abnormality  PSYCH/NEURO: pleasant and cooperative, no obvious depression or anxiety, speech and thought processing grossly intact  ASSESSMENT AND PLAN:  Discussed the following assessment and plan:  Nausea and vomiting, intractability of vomiting not specified, unspecified vomiting type  Flank pain  Hematuria, unspecified type  Dysuria  -we discussed possible serious and likely etiologies, options for evaluation and workup, limitations of telemedicine visit vs in person visit, treatment, treatment risks and precautions. Pt prefers to treat via telemedicine empirically rather than in person at this moment.  Given the severity of symptoms and acute onset, with recent recurrent UTIs, discussed possibility of pyelonephritis, nephrolithiasis, complicated UTI versus other.  At minimum advised exam, urine and labs should be done promptly, possibly imaging pending results.  Recommended it would be best to have in person evaluation and discussed options.  She opted to go to urgent care this evening, will have her husband take her.  She may go to one of the nearby offices or come down to [place].   I discussed the assessment and treatment plan with the patient. The patient was provided an opportunity to ask questions and all were answered. The patient agreed with the plan and demonstrated an understanding of the instructions.     Nickola Major  [name], DO

## 2020-07-28 ENCOUNTER — Ambulatory Visit: Payer: Self-pay

## 2020-07-28 ENCOUNTER — Other Ambulatory Visit: Payer: Self-pay

## 2020-07-28 ENCOUNTER — Emergency Department
Admission: RE | Admit: 2020-07-28 | Discharge: 2020-07-28 | Disposition: A | Discharge status: Home / Self Care | Payer: 59 | Source: Ambulatory Visit

## 2020-07-28 VITALS — BP 104/71 | HR 81 | Temp 97.6°F | Resp 18

## 2020-07-28 DIAGNOSIS — N3001 Acute cystitis with hematuria: Secondary | ICD-10-CM | POA: Diagnosis not present

## 2020-07-28 DIAGNOSIS — N390 Urinary tract infection, site not specified: Secondary | ICD-10-CM

## 2020-07-28 LAB — POCT URINALYSIS DIP (MANUAL ENTRY)
Blood, UA: NEGATIVE
Glucose, UA: 250 mg/dL — AB
Nitrite, UA: POSITIVE — AB
Protein Ur, POC: 100 mg/dL — AB
Spec Grav, UA: <=1.005 — AB (ref 1.010–1.025)
Urobilinogen, UA: 4 E.U./dL — AB
pH, UA: 5 (ref 5.0–8.0)

## 2020-07-28 MED ORDER — CEPHALEXIN 500 MG PO CAPS: 500.0000 mg | ORAL_CAPSULE | Freq: Two times a day (BID) | ORAL | 0 refills | Status: AC

## 2020-07-28 NOTE — ED Triage Notes (Signed)
Pt presents to Urgent Care with c/o dysuria, frequency, flank pain, and hematuria x 1 day. Reports she had a UTI 3 weeks ago and was treated w/ antibiotic. Symptoms had resolved until yesterday.

## 2020-07-28 NOTE — ED Provider Notes (Signed)
[facility]    CSN: [id number] Arrival date & time: 07/28/20  0847      History   Chief Complaint Chief Complaint  Patient presents with  . Dysuria    HPI Karen Lozano is a 49 y.o. female.   HPI Karen Lozano is a 49 y.o. female presenting to UC with c/o dysuria, frequency, and flank pain with hematuria since yesterday. She had one episode of vomiting yesterday and flank pain has resolved since taking her Azo.  She taking 5 days of previously prescribed bactrim for suspected UTI.  Hx of recurrent UTIs. She takes Macrobid as a preventative after sex but also had a prior prescription for bactrim. She discussed her symptoms with GYN who advised her to try the bactrim. No culture was performed the other week. Denies fever or chills. No abdominal pain or back pain today.    Past Medical History:  Diagnosis Date  . 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 Anxiety   . History of anemia    during pregnancy  . Migraine    had previous MRI/MRA-diag with ocular migraines  . Seasonal affective disorder ([place]) 10/07/2018   Tried lexapro and one other SSRI: depressed libido and couldn't sleep    Patient Active Problem List   Diagnosis Date Noted  . 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 Perimenopausal 10/07/2018  . ADD (attention deficit disorder) 10/07/2018  . Seasonal affective disorder ([place]) 10/07/2018  . Migraine headache 01/19/2016  . Antiphospholipid antibody positive 03/16/2015    Past Surgical History:  Procedure Laterality Date  . CESAREAN SECTION     x2  . TONSILLECTOMY    . WISDOM TOOTH EXTRACTION      OB History    Gravida  3   Para  2   Term      Preterm      AB  1   Living  2     SAB  1   TAB      Ectopic      Multiple      Live Births               Home Medications    Prior to Admission medications   Medication Sig Start Date End Date Taking? Authorizing Provider  Biotin  1000 MCG CHEW Chew 1 tablet by mouth daily.   Yes [provider]  Maca Root (MACA PO) Take 1 tablet by mouth daily.   Yes [provider]  phenazopyridine (PYRIDIUM) 95 MG tablet Take 95 mg by mouth 3 (three) times daily as needed for pain.   Yes [provider]  cephALEXin (KEFLEX) 500 MG capsule Take 1 capsule (500 mg total) by mouth 2 (two) times daily for 7 days. 07/28/20 08/04/20  Noe Gens, PA-C  gabapentin (NEURONTIN) 100 MG capsule Take 3 capsules (300 mg total) by mouth at bedtime. 10/12/19   Megan Salon, MD  nitrofurantoin, macrocrystal-monohydrate, (MACROBID) 100 MG capsule Take 1 capsule (100 mg total) by mouth 2 (two) times daily. 10/12/19   Megan Salon, MD    Family History Family History  Problem Relation Age of Onset  . Endometriosis Mother        hysterectomy age 17  . Lymphoma Mother   . Diabetes Maternal Grandmother   . Colon cancer Maternal Grandmother        70's  . Hypertension Maternal Grandmother   . Stroke Father   .  Basal cell carcinoma Father        skin  . Stroke Daughter        in utero  . Learning disabilities Daughter   . ADD / ADHD Daughter     Social History Social History   Tobacco Use  . Smoking status: Never Smoker  . Smokeless tobacco: Never Used  Vaping Use  . Vaping Use: Never used  Substance Use Topics  . Alcohol use: Yes    Alcohol/week: 2.0 standard drinks    Types: 2 Glasses of wine per week    Comment: glass of wine every 6 months  . Drug use: No     Allergies   Patient has no known allergies.   Review of Systems Review of Systems  Constitutional: Negative for chills and fever.  Gastrointestinal: Positive for vomiting. Negative for abdominal pain, diarrhea and nausea.  Genitourinary: Positive for dysuria, flank pain, frequency, hematuria and urgency. Negative for vaginal bleeding, vaginal discharge and vaginal pain.  Neurological: Negative for dizziness, light-headedness and headaches.      Physical Exam Triage Vital Signs ED Triage Vitals  Enc Vitals Group     BP 07/28/20 0905 104/71     Pulse Rate 07/28/20 0905 81     Resp 07/28/20 0905 18     Temp 07/28/20 0905 97.6 F (36.4 C)     Temp Source 07/28/20 0905 Oral     SpO2 07/28/20 0905 99 %     Weight --      Height --      Head Circumference --      Peak Flow --      Pain Score 07/28/20 0901 0     Pain Loc --      Pain Edu? --      Excl. in [place]? --    No data found.  Updated Vital Signs BP 104/71 (BP Location: Left Arm)   Pulse 81   Temp 97.6 F (36.4 C) (Oral)   Resp 18   LMP 10/01/2018   SpO2 99%   Visual Acuity Right Eye Distance:   Left Eye Distance:   Bilateral Distance:    Right Eye Near:   Left Eye Near:    Bilateral Near:     Physical Exam Vitals and nursing note reviewed.  Constitutional:      General: She is not in acute distress.    Appearance: Normal appearance. She is well-developed. She is not ill-appearing, toxic-appearing or diaphoretic.  HENT:     Head: Normocephalic and atraumatic.  Cardiovascular:     Rate and Rhythm: Normal rate and regular rhythm.  Pulmonary:     Effort: Pulmonary effort is normal. No respiratory distress.     Breath sounds: Normal breath sounds.  Abdominal:     General: There is no distension.     Palpations: Abdomen is soft.     Tenderness: There is no abdominal tenderness. There is no right CVA tenderness or left CVA tenderness.  Musculoskeletal:        General: Normal range of motion.     Cervical back: Normal range of motion.  Skin:    General: Skin is warm and dry.  Neurological:     Mental Status: She is alert and oriented to person, place, and time.  Psychiatric:        Behavior: Behavior normal.      UC Treatments / Results  Labs (all labs ordered are listed, but only abnormal results are displayed) Labs Reviewed  POCT URINALYSIS DIP (MANUAL ENTRY) - Abnormal; Notable for the following components:      Result Value   Color,  UA orange (*)    Glucose, UA =250 (*)    Bilirubin, UA small (*)    Ketones, POC UA trace (5) (*)    Spec Grav, UA <=1.005 (*)    Protein Ur, POC =100 (*)    Urobilinogen, UA 4.0 (*)    Nitrite, UA Positive (*)    Leukocytes, UA Large (3+) (*)    All other components within normal limits  URINE CULTURE    EKG   Radiology No results found.  Procedures Procedures (including critical care time)  Medications Ordered in UC Medications - No data to display  Initial Impression / Assessment and Plan / UC Course  I have reviewed the triage vital signs and the nursing notes.  Pertinent labs & imaging results that were available during my care of the patient were reviewed by me and considered in my medical decision making (see chart for details).    Hx and UA c/w UTI Culture sent Encouraged f/u with PCP or GYN as needed AVS given  Final Clinical Impressions(s) / UC Diagnoses   Final diagnoses:  Acute cystitis with hematuria  Recurrent UTI     Discharge Instructions      Please take your antibiotic as prescribed. A urine culture has been sent to check the severity of your urinary infection and to determine if you are on the most appropriate antibiotic. The results should come back within 2-3 days. You will only be notified if a medication change is indicated.  Please follow up with family medicine or urology if not improving within 1 week, sooner if symptoms worsening.      ED Prescriptions    Medication Sig Dispense Auth. Provider   cephALEXin (KEFLEX) 500 MG capsule Take 1 capsule (500 mg total) by mouth 2 (two) times daily for 7 days. 14 capsule Noe Gens, [place]     PDMP not reviewed this encounter.   Noe Gens, [place] 07/28/20 [phone]

## 2020-07-28 NOTE — Discharge Instructions (Signed)
  Please take your antibiotic as prescribed. A urine culture has been sent to check the severity of your urinary infection and to determine if you are on the most appropriate antibiotic. The results should come back within 2-3 days. You will only be notified if a medication change is indicated.  Please follow up with family medicine or urology if not improving within 1 week, sooner if symptoms worsening.   

## 2020-07-29 LAB — URINE CULTURE
MICRO NUMBER:: 11226487
Result:: NO GROWTH
SPECIMEN QUALITY:: ADEQUATE

## 2020-08-18 ENCOUNTER — Other Ambulatory Visit: Payer: Self-pay

## 2020-08-18 ENCOUNTER — Telehealth (INDEPENDENT_AMBULATORY_CARE_PROVIDER_SITE_OTHER): Payer: 59 | Admitting: Family Medicine

## 2020-08-18 ENCOUNTER — Encounter: Payer: Self-pay | Admitting: Family Medicine

## 2020-08-18 DIAGNOSIS — N951 Menopausal and female climacteric states: Secondary | ICD-10-CM | POA: Diagnosis not present

## 2020-08-18 DIAGNOSIS — F902 Attention-deficit hyperactivity disorder, combined type: Secondary | ICD-10-CM

## 2020-08-18 MED ORDER — ATOMOXETINE HCL 40 MG PO CAPS: 40.0000 mg | ORAL_CAPSULE | Freq: Every day | ORAL | 5 refills | Status: DC

## 2020-08-18 NOTE — Progress Notes (Signed)
Virtual Visit via Video Note  Subjective  CC:  Chief Complaint  Patient presents with  . ADHD    Previously on Ritalin and believed to cause insomnia, so she stopped. Requesting to restart ADHD medication.      I connected with Glenford Peers on 08/18/20 at 11:30 AM EST by a video enabled telemedicine application and verified that I am speaking with the correct person using two identifiers. Location patient: Home Location provider: [facility] [place], Office Persons participating in the virtual visit: Wesleigh Markovic, Leamon Arnt, MD Reymundo Poll CMA  I discussed the limitations of evaluation and management by telemedicine and the availability of in person appointments. The patient expressed understanding and agreed to proceed. HPI: Karen Lozano is a 49 y.o. female who was contacted today to address the problems listed above in the chief complaint. . 49 year old perimenopausal female with problems sleeping and longstanding ADD diagnosed by [place] specialist who does not tolerate stimulant medications due to worsening sleep. Over the last month or 2 she has noticed increasing ADD symptoms. Having problems with focus and keeping on task at work. Not completing tasks. Would like to restart medicines. January 2020 she is started on Strattera and had a good response but thought that that might have interfered with sleep as well. Looking back, this may be more related to perimenopausal sleep problems and hot flushes. She has been intolerant to gabapentin due to hair thinning. She cannot take hormones because of history of antiphospholipid syndrome. She denies mood problems.   Assessment  1. Attention deficit hyperactivity disorder (ADHD), combined type   2. Menopausal symptoms      Plan   ADD: We will try Strattera 40 mg daily again. This should not interfere with sleep. She had a good response to it when we tried it initially. Education given. Recheck 4  weeks.  Perimenopausal/menopausal symptoms with hot flashes and poor sleep: Using Benadryl at night. If sleep worsens, would recommend trazodone, Elavil or doxepin.  I discussed the assessment and treatment plan with the patient. The patient was provided an opportunity to ask questions and all were answered. The patient agreed with the plan and demonstrated an understanding of the instructions.   The patient was advised to call back or seek an in-person evaluation if the symptoms worsen or if the condition fails to improve as anticipated. Follow up: 4 weeks for ADD and sleep follow-up 11/20/2020 for complete physical  Meds ordered this encounter  Medications  . atomoxetine (STRATTERA) 40 MG capsule    Sig: Take 1 capsule (40 mg total) by mouth daily.    Dispense:  30 capsule    Refill:  5      I reviewed the patients updated PMH, FH, and SocHx.    Patient Active Problem List   Diagnosis Date Noted  . 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 Menopausal symptoms 10/07/2018  . ADD (attention deficit disorder) 10/07/2018  . Seasonal affective disorder ([place]) 10/07/2018  . Migraine headache 01/19/2016  . 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 ASHWAGANDHA PO Take by mouth.  . Biotin 1000 MCG CHEW Chew 1 tablet by mouth daily.  . diphenhydrAMINE HCl, Sleep, (ZZZQUIL PO) Take by mouth.  . Maca Root (MACA PO) Take 1 tablet by mouth daily.    Allergies: Patient has No Known Allergies. Family History: Patient family history includes ADD / ADHD in her daughter; Basal cell carcinoma in her  father; Colon cancer in her maternal grandmother; Diabetes in her maternal grandmother; Endometriosis in her mother; Hypertension in her maternal grandmother; Learning disabilities in her daughter; Lymphoma in her mother; Stroke in her daughter and father. Social History:  Patient  reports that she has never smoked. She has never used smokeless tobacco. She reports current  alcohol use of about 2.0 standard drinks of alcohol per week. She reports that she does not use drugs.  Review of Systems: Constitutional: Negative for fever malaise or anorexia Cardiovascular: negative for chest pain Respiratory: negative for SOB or persistent cough Gastrointestinal: negative for abdominal pain  OBJECTIVE Vitals: LMP 10/01/2018  General: no acute distress , A&Ox3  Leamon Arnt, MD

## 2020-09-03 ENCOUNTER — Other Ambulatory Visit: Payer: Self-pay | Admitting: Family Medicine

## 2020-09-04 ENCOUNTER — Encounter: Payer: Self-pay | Admitting: Family Medicine

## 2020-09-13 ENCOUNTER — Other Ambulatory Visit: Payer: Self-pay | Admitting: Family Medicine

## 2020-09-13 MED ORDER — ATOMOXETINE HCL 40 MG PO CAPS: 40.0000 mg | ORAL_CAPSULE | Freq: Every day | ORAL | 5 refills | Status: DC

## 2020-09-13 NOTE — Addendum Note (Signed)
Addended by: Asencion Partridge on: 09/13/2020 07:59 AM   Modules accepted: Orders

## 2020-09-16 MED FILL — ATOMOXETINE HCL 40 MG CAPS: 40 | 30 days supply | Qty: 30 | Fill #0

## 2020-10-18 ENCOUNTER — Encounter: Payer: Self-pay | Admitting: Family Medicine

## 2020-10-18 NOTE — Telephone Encounter (Signed)
Please advise 

## 2020-11-14 ENCOUNTER — Other Ambulatory Visit: Payer: Self-pay

## 2020-11-14 ENCOUNTER — Ambulatory Visit (INDEPENDENT_AMBULATORY_CARE_PROVIDER_SITE_OTHER): Payer: 59 | Admitting: Obstetrics & Gynecology

## 2020-11-14 ENCOUNTER — Other Ambulatory Visit (HOSPITAL_BASED_OUTPATIENT_CLINIC_OR_DEPARTMENT_OTHER): Payer: Self-pay | Admitting: Obstetrics & Gynecology

## 2020-11-14 ENCOUNTER — Other Ambulatory Visit (HOSPITAL_BASED_OUTPATIENT_CLINIC_OR_DEPARTMENT_OTHER): Payer: Self-pay

## 2020-11-14 ENCOUNTER — Encounter (HOSPITAL_BASED_OUTPATIENT_CLINIC_OR_DEPARTMENT_OTHER): Payer: Self-pay | Admitting: Obstetrics & Gynecology

## 2020-11-14 VITALS — BP 103/62 | HR 78 | Wt 123.8 lb

## 2020-11-14 DIAGNOSIS — G4709 Other insomnia: Secondary | ICD-10-CM

## 2020-11-14 DIAGNOSIS — R232 Flushing: Secondary | ICD-10-CM | POA: Diagnosis not present

## 2020-11-14 DIAGNOSIS — Z800 Family history of malignant neoplasm of digestive organs: Secondary | ICD-10-CM

## 2020-11-14 DIAGNOSIS — Z1211 Encounter for screening for malignant neoplasm of colon: Secondary | ICD-10-CM | POA: Diagnosis not present

## 2020-11-14 DIAGNOSIS — R760 Raised antibody titer: Secondary | ICD-10-CM

## 2020-11-14 DIAGNOSIS — F909 Attention-deficit hyperactivity disorder, unspecified type: Secondary | ICD-10-CM | POA: Diagnosis not present

## 2020-11-14 MED ORDER — BUPROPION HCL ER (SR) 100 MG PO TB12
100.0000 mg | ORAL_TABLET | Freq: Every morning | ORAL | 1 refills | Status: DC
  Filled 2020-11-14: qty 30, 30d supply, fill #0

## 2020-11-14 MED FILL — BUPROPION HCL SR 100 MG TAB: 100 | 30 days supply | Qty: 30 | Fill #0

## 2020-11-14 NOTE — Progress Notes (Signed)
GYNECOLOGY  VISIT  CC:   Discuss possible medication  HPI: 50 y.o. [id number] Married White or Caucasian female here for discussion of medication.  She is having some issues with sleep.  She did use gabapentin in the past due to hot flashes.  She had hair loss so stopped this.  She cannot be on estrogens.  She is tolerating her hot flashes.   She is now in counseling with Berle Mull for stressors with her youngest.  Pt has ADD.  Treatment was recommended.  She saw Dr. Jonni Sanger who felt uncomfortable starting pt on a stimulant.  She tried Oncologist.  She took it for a month and she felt it didn't work.  She called back and referral was recommended to [facility].  She's been there in the past and didn't feel like she wanted to redo all of the testing that was initially done several years ago.  It was very expensive as well.    She's discussed this with her counselor, Tye Maryland, who suggested wellbutrin.  This is an off label use for Wellbutrin.  Pt is concerned about sexual side effects but this is not typical with Wellbutrin.  Side effects discussed as well as dosing.  Pt has been very sensitive to medication in the past.    She is changing jobs this year.  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  Will be working from home and is really excited about this.  Lastly, pt needs referral to GI for colonoscopy.  We've discussed this in the past and she is ready now.  GYNECOLOGIC HISTORY: Patient's last menstrual period was 10/01/2018.  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     diphenhydrAMINE HCl, Sleep, (ZZZQUIL PO) Take by mouth.     No current facility-administered medications on file prior to visit.    ALLERGIES: Patient has no known allergies.  Family History  Problem Relation Age of Onset   Endometriosis Mother        hysterectomy age 22   Lymphoma Mother    Diabetes Maternal Grandmother    Colon cancer Maternal Grandmother        70's   Hypertension Maternal Grandmother    Stroke Father    Basal cell carcinoma Father        skin   Stroke Daughter        in utero   Learning disabilities Daughter    ADD / ADHD Daughter     SH:  Married, non smoker  Review of Systems  Constitutional: Negative.   Psychiatric/Behavioral: Negative.     PHYSICAL EXAMINATION:    BP 103/62    Pulse 78    Wt 123 lb 12.8 oz (56.2 kg)    LMP 10/01/2018    SpO2 98%    BMI 22.11 kg/m     General appearance: alert, cooperative and appears stated age  No chaperone needed for this visit  Assessment/Plan: 1. Hot flashes - off any treatment at this time  2. Other insomnia  3. Attention deficit hyperactivity disorder (ADHD), unspecified ADHD type - buPROPion (WELLBUTRIN SR) 100 MG 12 hr tablet; Take 1 tablet (100 mg total) by mouth in the morning.  Dispense: 30 tablet; Refill: 1 - pt will give update in 3-4 weeks.  Will increase to bid dosing and then 150mg  XL if needed  4. Colon cancer screening and family  history of colon cancer - Ambulatory referral to Gastroenterology  5. Antiphospholipid antibody positive - cannot take estrogens     24 minutes in total time spent with pt in discussion of concerns, recommendations, prescriptions.

## 2020-11-15 ENCOUNTER — Encounter (HOSPITAL_BASED_OUTPATIENT_CLINIC_OR_DEPARTMENT_OTHER): Payer: Self-pay | Admitting: Obstetrics & Gynecology

## 2020-11-15 ENCOUNTER — Other Ambulatory Visit (HOSPITAL_BASED_OUTPATIENT_CLINIC_OR_DEPARTMENT_OTHER): Payer: Self-pay

## 2020-11-20 ENCOUNTER — Encounter: Payer: 59 | Admitting: Family Medicine

## 2020-12-11 ENCOUNTER — Other Ambulatory Visit (HOSPITAL_BASED_OUTPATIENT_CLINIC_OR_DEPARTMENT_OTHER): Payer: Self-pay

## 2020-12-11 MED FILL — Bupropion HCl Tab ER 12HR 100 MG: ORAL | 30 days supply | Qty: 30 | Fill #0 | Status: AC

## 2021-01-04 ENCOUNTER — Encounter: Payer: Self-pay | Admitting: Gastroenterology

## 2021-01-05 ENCOUNTER — Ambulatory Visit: Payer: 59

## 2021-01-16 ENCOUNTER — Ambulatory Visit (HOSPITAL_BASED_OUTPATIENT_CLINIC_OR_DEPARTMENT_OTHER): Payer: 59 | Admitting: Obstetrics & Gynecology

## 2021-01-18 ENCOUNTER — Ambulatory Visit (HOSPITAL_BASED_OUTPATIENT_CLINIC_OR_DEPARTMENT_OTHER): Payer: 59 | Admitting: Obstetrics & Gynecology

## 2021-01-25 ENCOUNTER — Ambulatory Visit (HOSPITAL_BASED_OUTPATIENT_CLINIC_OR_DEPARTMENT_OTHER): Payer: 59 | Admitting: Obstetrics & Gynecology

## 2021-03-01 ENCOUNTER — Ambulatory Visit (AMBULATORY_SURGERY_CENTER): Payer: 59

## 2021-03-01 VITALS — Ht 62.75 in | Wt 123.0 lb

## 2021-03-01 DIAGNOSIS — Z1211 Encounter for screening for malignant neoplasm of colon: Secondary | ICD-10-CM

## 2021-03-01 NOTE — Progress Notes (Signed)

## 2021-03-14 ENCOUNTER — Encounter: Payer: Self-pay | Admitting: Gastroenterology

## 2021-03-15 ENCOUNTER — Encounter: Payer: Self-pay | Admitting: Gastroenterology

## 2021-03-15 ENCOUNTER — Other Ambulatory Visit: Payer: Self-pay

## 2021-03-15 ENCOUNTER — Ambulatory Visit (AMBULATORY_SURGERY_CENTER): Payer: 59 | Admitting: Gastroenterology

## 2021-03-15 VITALS — BP 104/65 | HR 61 | Temp 98.4°F | Resp 14 | Ht 62.75 in | Wt 123.0 lb

## 2021-03-15 DIAGNOSIS — Z1211 Encounter for screening for malignant neoplasm of colon: Secondary | ICD-10-CM | POA: Diagnosis present

## 2021-03-15 DIAGNOSIS — K621 Rectal polyp: Secondary | ICD-10-CM

## 2021-03-15 DIAGNOSIS — D128 Benign neoplasm of rectum: Secondary | ICD-10-CM

## 2021-03-15 MED ORDER — SODIUM CHLORIDE 0.9 % IV SOLN: 500.0000 mL | Freq: Once | INTRAVENOUS | Status: DC

## 2021-03-15 NOTE — Patient Instructions (Signed)
Resume previous diet and medications. Awaiting pathology. Repeat colonoscopy date to be determined after pathology results reviewed.  YOU HAD AN ENDOSCOPIC PROCEDURE TODAY AT [facility]:   Refer to the procedure report that was given to you for any specific questions about what was found during the examination.  If the procedure report does not answer your questions, please call your gastroenterologist to clarify.  If you requested that your care partner not be given the details of your procedure findings, then the procedure report has been included in a sealed envelope for you to review at your convenience later.  YOU SHOULD EXPECT: Some feelings of bloating in the abdomen. Passage of more gas than usual.  Walking can help get rid of the air that was put into your GI tract during the procedure and reduce the bloating. If you had a lower endoscopy (such as a colonoscopy or flexible sigmoidoscopy) you may notice spotting of blood in your stool or on the toilet paper. If you underwent a bowel prep for your procedure, you may not have a normal bowel movement for a few days.  Please Note:  You might notice some irritation and congestion in your nose or some drainage.  This is from the oxygen used during your procedure.  There is no need for concern and it should clear up in a day or so.  SYMPTOMS TO REPORT IMMEDIATELY:  Following lower endoscopy (colonoscopy or flexible sigmoidoscopy):  Excessive amounts of blood in the stool  Significant tenderness or worsening of abdominal pains  Swelling of the abdomen that is new, acute  Fever of 100F or higher  For urgent or emergent issues, a gastroenterologist can be reached at any hour by calling [phone]. Do not use MyChart messaging for urgent concerns.    DIET:  We do recommend a small meal at first, but then you may proceed to your regular diet.  Drink plenty of fluids but you should avoid alcoholic beverages for 24  hours.  ACTIVITY:  You should plan to take it easy for the rest of today and you should NOT DRIVE or use heavy machinery until tomorrow (because of the sedation medicines used during the test).    FOLLOW UP: Our staff will call the number listed on your records 48-72 hours following your procedure to check on you and address any questions or concerns that you may have regarding the information given to you following your procedure. If we do not reach you, we will leave a message.  We will attempt to reach you two times.  During this call, we will ask if you have developed any symptoms of COVID 19. If you develop any symptoms (ie: fever, flu-like symptoms, shortness of breath, cough etc.) before then, please call [phone].  If you test positive for Covid 19 in the 2 weeks post procedure, please call and report this information to [place].    If any biopsies were taken you will be contacted by phone or by letter within the next 1-3 weeks.  Please call us at [phone] if you have not heard about the biopsies in 3 weeks.    SIGNATURES/CONFIDENTIALITY: You and/or your care partner have signed paperwork which will be entered into your electronic medical record.  These signatures attest to the fact that that the information above on your After Visit Summary has been reviewed and is understood.  Full responsibility of the confidentiality of this discharge information lies with you and/or your care-partner.

## 2021-03-15 NOTE — Op Note (Signed)
[facility] Patient Name: Karen Lozano Procedure Date: 03/15/2021 9:05 AM MRN: [id number] Endoscopist: Thornton Park MD, MD Age: 50 Referring MD:  Date of Birth: 09/20/1970 Gender: Female Account #: [id number] Procedure:                Colonoscopy Indications:              Screening for colorectal malignant neoplasm, This                            is the patient's first colonoscopy Medicines:                Monitored Anesthesia Care Procedure:                Pre-Anesthesia Assessment:                           - Prior to the procedure, a History and Physical                            was performed, and patient medications and                            allergies were reviewed. The patient's tolerance of                            previous anesthesia was also reviewed. The risks                            and benefits of the procedure and the sedation                            options and risks were discussed with the patient.                            All questions were answered, and informed consent                            was obtained. Prior Anticoagulants: The patient has                            taken no previous anticoagulant or antiplatelet                            agents. ASA Grade Assessment: II - A patient with                            mild systemic disease. After reviewing the risks                            and benefits, the patient was deemed in                            satisfactory condition to undergo the procedure.  After obtaining informed consent, the colonoscope                            was passed under direct vision. Throughout the                            procedure, the patient's blood pressure, pulse, and                            oxygen saturations were monitored continuously. The                            CF [id number] #[id number] was introduced through the anus                            and advanced to the 3 cm  into the ileum. A second                            forward view of the right colon was performed. The                            colonoscopy was performed without difficulty. The                            patient tolerated the procedure well. The quality                            of the bowel preparation was good. The terminal                            ileum, ileocecal valve, appendiceal orifice, and                            rectum were photographed. Scope In: 9:09:50 AM Scope Out: 9:23:45 AM Scope Withdrawal Time: 0 hours 10 minutes 32 seconds  Total Procedure Duration: 0 hours 13 minutes 55 seconds  Findings:                 The perianal and digital rectal examinations were                            normal.                           A 1 mm polyp was found in the distal rectum. The                            polyp was flat. The polyp was removed with a cold                            biopsy forceps. Resection and retrieval were                            complete. Estimated blood loss  was minimal.                           The exam was otherwise without abnormality on                            direct and retroflexion views. Complications:            No immediate complications. Estimated blood loss:                            Minimal. Estimated Blood Loss:     Estimated blood loss was minimal. Impression:               - One 1 mm polyp in the distal rectum, removed with                            a cold biopsy forceps. Resected and retrieved.                           - The examination was otherwise normal on direct                            and retroflexion views. Recommendation:           - Patient has a contact number available for                            emergencies. The signs and symptoms of potential                            delayed complications were discussed with the                            patient. Return to normal activities tomorrow.                             Written discharge instructions were provided to the                            patient.                           - Resume previous diet.                           - Continue present medications.                           - Await pathology results.                           - Repeat colonoscopy date to be determined after                            pending pathology results are reviewed for  surveillance.                           - Emerging evidence supports eating a diet of                            fruits, vegetables, grains, calcium, and yogurt                            while reducing red meat and alcohol may reduce the                            risk of colon cancer.                           - Thank you for allowing me to be involved in your                            colon cancer prevention. Thornton Park MD, MD 03/15/2021 9:26:10 AM This report has been signed electronically.

## 2021-03-15 NOTE — Progress Notes (Signed)
VS-CW  Pt's states no medical or surgical changes since previsit or office visit.  

## 2021-03-15 NOTE — Progress Notes (Signed)
pt tolerated well. VSS. awake and to recovery. Report given to RN.  

## 2021-03-15 NOTE — Progress Notes (Signed)
Called to room to assist during endoscopic procedure.  Patient ID and intended procedure confirmed with present staff. Received instructions for my participation in the procedure from the performing physician.  

## 2021-03-19 ENCOUNTER — Telehealth: Payer: Self-pay

## 2021-03-19 NOTE — Telephone Encounter (Signed)
LVM

## 2021-03-22 ENCOUNTER — Encounter: Payer: Self-pay | Admitting: Gastroenterology

## 2021-07-03 ENCOUNTER — Encounter: Payer: Self-pay | Admitting: Physician Assistant

## 2021-07-03 ENCOUNTER — Telehealth (INDEPENDENT_AMBULATORY_CARE_PROVIDER_SITE_OTHER): Payer: 59 | Admitting: Physician Assistant

## 2021-07-03 DIAGNOSIS — R0602 Shortness of breath: Secondary | ICD-10-CM

## 2021-07-03 DIAGNOSIS — R051 Acute cough: Secondary | ICD-10-CM | POA: Diagnosis not present

## 2021-07-03 MED ORDER — AZITHROMYCIN 250 MG PO TABS: ORAL_TABLET | ORAL | 0 refills | Status: DC

## 2021-07-03 MED ORDER — AMOXICILLIN-POT CLAVULANATE 875-125 MG PO TABS: 1.0000 | ORAL_TABLET | Freq: Two times a day (BID) | ORAL | 0 refills | Status: AC

## 2021-07-03 MED ORDER — PREDNISONE 20 MG PO TABS: 20.0000 mg | ORAL_TABLET | Freq: Two times a day (BID) | ORAL | 0 refills | Status: AC

## 2021-07-03 MED ORDER — ALBUTEROL SULFATE HFA 108 (90 BASE) MCG/ACT IN AERS: 2.0000 | INHALATION_SPRAY | Freq: Four times a day (QID) | RESPIRATORY_TRACT | 2 refills | Status: DC | PRN

## 2021-07-03 MED ORDER — HYDROCOD POLST-CPM POLST ER 10-8 MG/5ML PO SUER: 5.0000 mL | Freq: Every evening | ORAL | 0 refills | Status: DC | PRN

## 2021-07-03 NOTE — Progress Notes (Signed)
Virtual Visit via Video Note  I connected with  Karen Lozano  on 07/03/21 at 11:45 AM EDT by a video enabled telemedicine application and verified that I am speaking with the correct person using two identifiers.  Location: Patient: home Provider: Therapist, music at [facility] present: Patient and myself   I discussed the limitations of evaluation and management by telemedicine and the availability of in person appointments. The patient expressed understanding and agreed to proceed.   History of Present Illness: Chief complaint: Cough Symptom onset: 5 days ago  Pertinent positives: Body aches and chills, short of breath, fatigue Pertinent negatives: Chest pain, N/V/D, dizziness Treatments tried: Tylenol with Codeine, Nyquil last night and able to sleep some Vaccine status: Flu shot UTD, no COVID vaccines  Sick exposure: Infant in nursery had RSV   3 at-home COVID tests have been negative. She does not have asthma or hx of respiratory issues.    Observations/Objective:  SpO2: 94-96% HR: 90 bpm  Gen: Awake, alert, no acute distress Resp: Breathing is even and non-labored; paroxysmal coughing  Psych: calm/pleasant demeanor Neuro: Alert and Oriented x 3, + facial symmetry, speech is clear.   Assessment and Plan:  1. Acute cough 2. 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  I am concerned about her possibly having community-acquired pneumonia with her symptoms she is experiencing.  Thankfully, she works as a Marine scientist and is able to monitor her vital signs at home.  I will treat empirically at this time with Augmentin and Z-Pak.  Prednisone and rescue inhaler may help with her breathing as well.  Also gave prescription for Tussionex for her to take at bedtime to help her to get some rest.  Low threshold for recheck in person if worse or no improvement of symptoms.  She is going to call with updates.   Follow Up Instructions:    I  discussed the assessment and treatment plan with the patient. The patient was provided an opportunity to ask questions and all were answered. The patient agreed with the plan and demonstrated an understanding of the instructions.   The patient was advised to call back or seek an in-person evaluation if the symptoms worsen or if the condition fails to improve as anticipated.  Jarissa Sheriff M Mylo Driskill, PA-C

## 2021-11-12 ENCOUNTER — Encounter: Payer: Self-pay | Admitting: Family

## 2021-11-12 ENCOUNTER — Other Ambulatory Visit: Payer: Self-pay

## 2021-11-12 ENCOUNTER — Ambulatory Visit (INDEPENDENT_AMBULATORY_CARE_PROVIDER_SITE_OTHER): Payer: 59 | Admitting: Family

## 2021-11-12 VITALS — BP 109/70 | HR 63 | Temp 97.9°F | Ht 62.75 in | Wt 124.2 lb

## 2021-11-12 DIAGNOSIS — N390 Urinary tract infection, site not specified: Secondary | ICD-10-CM

## 2021-11-12 LAB — POCT URINALYSIS DIPSTICK
Bilirubin, UA: POSITIVE
Blood, UA: POSITIVE
Glucose, UA: NEGATIVE
Ketones, UA: NEGATIVE
Nitrite, UA: POSITIVE
Protein, UA: NEGATIVE
Spec Grav, UA: 1.015 (ref 1.010–1.025)
Urobilinogen, UA: 1 E.U./dL
pH, UA: 6 (ref 5.0–8.0)

## 2021-11-12 MED ORDER — SULFAMETHOXAZOLE-TRIMETHOPRIM 800-160 MG PO TABS: 1.0000 | ORAL_TABLET | Freq: Two times a day (BID) | ORAL | 0 refills | Status: DC

## 2021-11-12 NOTE — Progress Notes (Signed)
? ?Subjective:  ? ? ? Patient ID: Karen Lozano, female    DOB: [birth year], 51 y.o.   MRN: [id number] ? ?Chief Complaint  ?Patient presents with  ? Dysuria  ?  Pt c/o of burning with urination frequency and urgency. Some hematuria noted yesterday. Symptoms started Saturday, some chills and nausea last night. Denies back pain.  ? ? ?HPI: ?Urinary symptoms: Patient c/o  dysuria, frequency, urgency, flank pain, pelvic pain, low back pain, foul odor, cloudy urine, hematuria. Other sx: vaginal d/c none, vaginal itching none. Duration of sx: 1 day; Home tx: none; Denies  nausea, fever. Reports last UTI about less than year ago.  ? ? ?Health Maintenance Due  ?Topic Date Due  ? Hepatitis C Screening  Never done  ? Zoster Vaccines- Shingrix (1 of 2) Never done  ? INFLUENZA VACCINE  04/09/2021  ? MAMMOGRAM  07/11/2021  ? ? ?Past Medical History:  ?Diagnosis Date  ? Anemia   ? Anticardiolipin antibody positive   ? 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 Anxiety   ? History of anemia   ? during pregnancy  ? Migraine   ? had previous MRI/MRA-diag with ocular migraines  ? Seasonal affective disorder ([place]) 10/07/2018  ? Tried lexapro and one other SSRI: depressed libido and couldn't sleep  ? ? ?Past Surgical History:  ?Procedure Laterality Date  ? CESAREAN SECTION    ? x2  ? TONSILLECTOMY    ? WISDOM TOOTH EXTRACTION    ? ? ?Outpatient Medications Prior to Visit  ?Medication Sig Dispense Refill  ? Biotin 1000 MCG CHEW Chew 1 tablet by mouth daily.    ? albuterol (PROAIR HFA) 108 (90 Base) MCG/ACT inhaler Inhale 2 puffs into the lungs every 6 (six) hours as needed for wheezing or shortness of breath. 1 each 2  ? azithromycin (ZITHROMAX Z-PAK) 250 MG tablet Take two tablets on day one, followed by one tablet daily for the next four days. 6 tablet 0  ? chlorpheniramine-HYDROcodone (TUSSIONEX PENNKINETIC ER) 10-8 MG/5ML SUER Take 5 mLs by mouth at bedtime as needed for cough. 115 mL 0  ? diphenhydrAMINE HCl,  Sleep, (ZZZQUIL PO) Take by mouth.    ? buPROPion (WELLBUTRIN SR) 100 MG 12 hr tablet TAKE 1 TABLET BY MOUTH EVERY MORNING (Patient not taking: Reported on 11/12/2021) 30 tablet 1  ? ?Facility-Administered Medications Prior to Visit  ?Medication Dose Route Frequency Provider Last Rate Last Admin  ? 0.9 %  sodium chloride infusion  500 mL Intravenous Once Thornton Park, MD      ? ? ?No Known Allergies ? ? ?   ?Objective:  ?  ?Physical Exam ?Vitals and nursing note reviewed.  ?Constitutional:   ?   Appearance: Normal appearance.  ?Cardiovascular:  ?   Rate and Rhythm: Normal rate and regular rhythm.  ?Pulmonary:  ?   Effort: Pulmonary effort is normal.  ?   Breath sounds: Normal breath sounds.  ?Musculoskeletal:     ?   General: Normal range of motion.  ?Skin: ?   General: Skin is warm and dry.  ?Neurological:  ?   Mental Status: She is alert.  ?Psychiatric:     ?   Mood and Affect: Mood normal.     ?   Behavior: Behavior normal.  ? ? ?BP 109/70 (BP Location: Left Arm, Patient Position: Sitting, Cuff Size: Normal)   Pulse 63   Temp 97.9 ?F (36.6 ?C) (Temporal)   Ht 5'  2.75" (1.594 m)   Wt 124 lb 4 oz (56.4 kg)   LMP 10/01/2018   SpO2 100%   BMI 22.19 kg/m?  ?Wt Readings from Last 3 Encounters:  ?11/12/21 124 lb 4 oz (56.4 kg)  ?03/15/21 123 lb (55.8 kg)  ?03/01/21 123 lb (55.8 kg)  ? ? ?   ?Assessment & Plan:  ? ?Problem List Items Addressed This Visit   ?None ?Visit Diagnoses   ? ? Urinary tract infection without hematuria, site unspecified    -  Primary  ? pt took left over Bactrim, 1 pill yesterday, sending 3d tx to pharmacy. Advised on preventative measures, and if having more often, may want to discuss vaginal estrogen cream/pill with GYN, should be safe with her clotting antibody concern. ? ?Relevant Orders  ? POCT Urinalysis Dipstick (Completed)  ? ?  ? ? ? ? ?

## 2021-11-14 LAB — URINE CULTURE
MICRO NUMBER:: 13091784
Result:: NO GROWTH
SPECIMEN QUALITY:: ADEQUATE

## 2022-01-09 ENCOUNTER — Ambulatory Visit (INDEPENDENT_AMBULATORY_CARE_PROVIDER_SITE_OTHER): Payer: 59 | Admitting: Family Medicine

## 2022-01-09 ENCOUNTER — Encounter: Payer: Self-pay | Admitting: Family Medicine

## 2022-01-09 VITALS — BP 100/64 | HR 73 | Temp 98.6°F | Ht 62.75 in | Wt 122.2 lb

## 2022-01-09 DIAGNOSIS — F902 Attention-deficit hyperactivity disorder, combined type: Secondary | ICD-10-CM

## 2022-01-09 DIAGNOSIS — Z1231 Encounter for screening mammogram for malignant neoplasm of breast: Secondary | ICD-10-CM

## 2022-01-09 MED ORDER — METHYLPHENIDATE HCL 10 MG PO TABS: 10.0000 mg | ORAL_TABLET | Freq: Every day | ORAL | 0 refills | Status: DC

## 2022-01-09 NOTE — Patient Instructions (Signed)
Please return  for a complete physical.  ? ?Let me know how the ADD medication does for you.  ?I have sent a 1 month supply to your pharmacy.  ? ?If you have any questions or concerns, please don't hesitate to send me a message via MyChart or call the office at [phone]. Thank you for visiting with [place] today! It's our pleasure caring for you.  ? ?I have ordered a mammogram and/or bone density for you as we discussed today: ?'[x]'$   Mammogram  ?'[]'$   Bone Density ? ?Please call the office checked below to schedule your appointment: ? ?'[x]'$   [facility]     ? [place], [place]       ? [phone]        ? ?'[]'$   [facility] ? [place]  ? [place], [place] ? [phone] ? ?

## 2022-01-09 NOTE — Progress Notes (Signed)
? ? ?Subjective  ?CC:  ?Chief Complaint  ?Patient presents with  ? ADHD  ?  Pt stated that she would like to discuss getting on some medication  ? ? ?HPI: Karen Lozano is a 51 y.o. female who presents to the office today to address the problems listed above in the chief complaint. ? ?Patient is here today for follow up of ADD/ADHD.  She has long history of ADD.  Has been intolerant to multiple medications due to problems with sleep interference.  Would like to try short acting stimulant.  She is now working from home and needs the extra help.  Failed Strattera and Adderall XR most recently. ? ?Overdue for health maintenance visit ?Assessment  ?1. Attention deficit hyperactivity disorder (ADHD), combined type   ?2. Encounter for screening mammogram for breast cancer   ? ?  ?Plan  ?ADD: We will try ritalin is short acting.  Monitor sleep 1 ?Rec cpe ?Mammogram ordered and pt to schedule.  ? ?Follow up: cpe  ?Orders Placed This Encounter  ?Procedures  ? MM DIGITAL SCREENING BILATERAL  ? ?Meds ordered this encounter  ?Medications  ? methylphenidate (RITALIN) 10 MG tablet  ?  Sig: Take 1-2 tablets (10-20 mg total) by mouth daily.  ?  Dispense:  60 tablet  ?  Refill:  0  ? ?  ? ?I reviewed the patients updated PMH, FH, and SocHx.  ?  ?Patient Active Problem List  ? Diagnosis Date Noted  ? 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 Menopausal symptoms 10/07/2018  ? ADD (attention deficit disorder) 10/07/2018  ? Seasonal affective disorder ([place]) 10/07/2018  ? Migraine headache 01/19/2016  ? Antiphospholipid antibody positive 03/16/2015  ? ?Current Meds  ?Medication Sig  ? methylphenidate (RITALIN) 10 MG tablet Take 1-2 tablets (10-20 mg total) by mouth daily.  ? ?Current Facility-Administered Medications for the 01/09/22 encounter (Office Visit) with Leamon Arnt, MD  ?Medication  ? 0.9 %  sodium chloride infusion  ? ? ?Allergies: ?Patient has No Known Allergies. ?Family History: ?Patient family history includes ADD / ADHD in her  daughter; Basal cell carcinoma in her father; Colon cancer in her maternal grandmother; Colon polyps in her maternal grandmother; Diabetes in her maternal grandmother; Endometriosis in her mother; Esophageal cancer in an other family member; Hypertension in her maternal grandmother; Learning disabilities in her daughter; Lymphoma in her mother; Rectal cancer in an other family member; Stomach cancer in an other family member; Stroke in her daughter and father. ?Social History:  ?Patient  reports that she has never smoked. She has never used smokeless tobacco. She reports that she does not currently use alcohol. She reports that she does not use drugs. ? ?Review of Systems: ?Constitutional: Negative for fever malaise or anorexia ?Cardiovascular: negative for chest pain ?Respiratory: negative for SOB or persistent cough ?Gastrointestinal: negative for abdominal pain ? ?Objective  ?Vitals: BP 100/64   Pulse 73   Temp 98.6 ?F (37 ?C)   Ht 5' 2.75" (1.594 m)   Wt 122 lb 3.2 oz (55.4 kg)   LMP 10/01/2018   SpO2 97%   BMI 21.82 kg/m?  ?General: no acute distress , A&Ox3 ? ? ? ?Commons side effects, risks, benefits, and alternatives for medications and treatment plan prescribed today were discussed, and the patient expressed understanding of the given instructions. Patient is instructed to call or message via MyChart if he/she has any questions or concerns regarding our treatment plan. No barriers to understanding were identified. We discussed Red  Flag symptoms and signs in detail. Patient expressed understanding regarding what to do in case of urgent or emergency type symptoms.  ?Medication list was reconciled, printed and provided to the patient in AVS. Patient instructions and summary information was reviewed with the patient as documented in the AVS. ?This note was prepared with assistance of Systems analyst. Occasional wrong-word or sound-a-like substitutions may have occurred due to the  inherent limitations of voice recognition software ? ? ? ?

## 2022-01-16 ENCOUNTER — Ambulatory Visit
Admission: RE | Admit: 2022-01-16 | Discharge: 2022-01-16 | Disposition: A | Discharge status: Home / Self Care | Payer: 59 | Source: Ambulatory Visit | Attending: Family Medicine | Admitting: Family Medicine

## 2022-01-16 DIAGNOSIS — Z1231 Encounter for screening mammogram for malignant neoplasm of breast: Secondary | ICD-10-CM

## 2022-03-01 ENCOUNTER — Telehealth: Payer: Self-pay | Admitting: Family Medicine

## 2022-03-05 MED ORDER — METHYLPHENIDATE HCL 10 MG PO TABS: 10.0000 mg | ORAL_TABLET | Freq: Every day | ORAL | 0 refills | Status: DC

## 2022-03-14 ENCOUNTER — Ambulatory Visit: Payer: 59 | Admitting: Physician Assistant

## 2022-03-20 ENCOUNTER — Ambulatory Visit (INDEPENDENT_AMBULATORY_CARE_PROVIDER_SITE_OTHER): Payer: 59 | Admitting: Family Medicine

## 2022-03-20 ENCOUNTER — Encounter: Payer: Self-pay | Admitting: Family Medicine

## 2022-03-20 VITALS — BP 92/80 | HR 85 | Temp 98.4°F | Ht 62.75 in | Wt 122.0 lb

## 2022-03-20 DIAGNOSIS — F338 Other recurrent depressive disorders: Secondary | ICD-10-CM

## 2022-03-20 DIAGNOSIS — Z0000 Encounter for general adult medical examination without abnormal findings: Secondary | ICD-10-CM

## 2022-03-20 DIAGNOSIS — Z1159 Encounter for screening for other viral diseases: Secondary | ICD-10-CM | POA: Diagnosis not present

## 2022-03-20 DIAGNOSIS — F902 Attention-deficit hyperactivity disorder, combined type: Secondary | ICD-10-CM | POA: Diagnosis not present

## 2022-03-20 DIAGNOSIS — N3000 Acute cystitis without hematuria: Secondary | ICD-10-CM

## 2022-03-20 MED ORDER — METHYLPHENIDATE HCL 20 MG PO TABS: 20.0000 mg | ORAL_TABLET | Freq: Two times a day (BID) | ORAL | 0 refills | Status: DC

## 2022-03-20 NOTE — Patient Instructions (Signed)
Please return in 6 months for ADD recheck/refills.  I have sent in 3 months of the '20mg'$  pills to take twice a day. Send me a message in 3 months for refills and let me know if you have problems with the dosing.   I will release your lab results to you on your MyChart account with further instructions. You may see the results before I do, but when I review them I will send you a message with my report or have my assistant call you if things need to be discussed. Please reply to my message with any questions. Thank you!   If you have any questions or concerns, please don't hesitate to send me a message via MyChart or call the office at [phone]. Thank you for visiting with [place] today! It's our pleasure caring for you.

## 2022-03-20 NOTE — Progress Notes (Signed)
Subjective  Chief Complaint  Patient presents with   Annual Exam    Pt here for Annual Exam and is not currently fasting    HPI: Karen Lozano is a 51 y.o. female who presents to [facility] today for a Female Wellness Visit. She also has the concerns and/or needs as listed above in the chief complaint. These will be addressed in addition to the Health Maintenance Visit.   Wellness Visit: annual visit with health maintenance review and exam without Pap  HM: sees Dr. Sabra Heck for female wellness and screens are current. CRC screen current and nl.  Chronic disease f/u and/or acute problem visit: (deemed necessary to be done in addition to the wellness visit): ADD: Started Ritalin 10 mg to 20 mg daily 1 to 2 months ago.  She has noticed big improvement in her symptoms on this medications.  She requires 20 mg in the morning.  Focus is much improved.  Can stay on task and work is a lot easier.  No more irritability or frustration due to inattention.  However it lasts only about 4 hours.-She can struggle in the afternoon.  No problems with sleep on this dose currently.  No adverse effects Mood is stable.  Assessment  1. Annual physical exam   2. Attention deficit hyperactivity disorder (ADHD), combined type   3. Seasonal affective disorder ([place])   4. Need for hepatitis C screening test      Plan  Female Wellness Visit: Age appropriate Health Maintenance and Prevention measures were discussed with patient. Included topics are cancer screening recommendations, ways to keep healthy (see AVS) including dietary and exercise recommendations, regular eye and dental care, use of seat belts, and avoidance of moderate alcohol use and tobacco use.  Screens are current BMI: discussed patient's BMI and encouraged positive lifestyle modifications to help get to or maintain a target BMI. HM needs and immunizations were addressed and ordered. See below for orders. See HM and  immunization section for updates. Routine labs and screening tests ordered including cmp, cbc and lipids where appropriate. Discussed recommendations regarding Vit D and calcium supplementation (see AVS)  Chronic disease management visit and/or acute problem visit: ADD: Much improved with Ritalin.  Will order Ritalin 20 mg to take in the morning and before lunch.  She will monitor her sleep. Seasonal affective disorder: Doing well this summer. Recent UTI: Education given.  Follow up: Return in about 6 months (around 09/20/2022) for follow up on ADD.  Orders Placed This Encounter  Procedures   CBC with Differential/Platelet   Comp Met (CMET)   Lipid Profile   Hepatitis C Antibody   TSH   Meds ordered this encounter  Medications   methylphenidate (RITALIN) 20 MG tablet    Sig: Take 1 tablet (20 mg total) by mouth in the morning and at bedtime.    Dispense:  60 tablet    Refill:  0   methylphenidate (RITALIN) 20 MG tablet    Sig: Take 1 tablet (20 mg total) by mouth in the morning and at bedtime.    Dispense:  60 tablet    Refill:  0   methylphenidate (RITALIN) 20 MG tablet    Sig: Take 1 tablet (20 mg total) by mouth in the morning and at bedtime.    Dispense:  60 tablet    Refill:  0      Body mass index is 21.78 kg/m. 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 Adderall, ritalin. Trial of stratterra initiated 09/2018; consider intuniv as well     Seasonal affective disorder ([place]) 10/07/2018    Tried lexapro and one other SSRI: depressed libido and couldn't sleep     Migraine headache 01/19/2016   Antiphospholipid antibody positive 03/16/2015   Health Maintenance  Topic Date Due   Hepatitis C Screening  Never done   Zoster Vaccines- Shingrix (1 of 2) 06/20/2022 (Originally 05/26/2020)   INFLUENZA VACCINE  04/09/2022   MAMMOGRAM  01/17/2023   PAP SMEAR-Modifier  05/01/2023   TETANUS/TDAP  09/09/2024   COLONOSCOPY (Pts 45-74yr Insurance coverage will need to be confirmed)  03/16/2031   HIV Screening  Completed   HPV VACCINES  Aged Out   Immunization History  Administered Date(s) Administered   Influenza-Unspecified 07/19/2020   We updated and reviewed the patient's past history in detail and it is documented below. Allergies: Patient has No Known Allergies. Past Medical History Patient  has a past medical history of Anemia, Anticardiolipin antibody positive, Anxiety, History of anemia, Migraine, and Seasonal affective disorder ([place] (10/07/2018). Past Surgical History Patient  has a past surgical history that includes Cesarean section; Tonsillectomy; and Wisdom tooth extraction. Family History: Patient family history includes ADD / ADHD in her daughter; Basal cell carcinoma in her father; Colon cancer in her maternal grandmother; Colon polyps in her maternal grandmother; Diabetes in her maternal grandmother; Endometriosis in her mother; Esophageal cancer in an other family member; Hypertension in her maternal grandmother; Learning disabilities in her daughter; Lymphoma in her mother; Rectal cancer in an other family member; Stomach cancer in an other family member; Stroke in her daughter and father. Social History:  Patient  reports that she has never smoked. She has never used smokeless tobacco. She reports that she does not currently use alcohol. She reports that she does not use drugs.  Review of Systems: Constitutional: negative for fever or malaise Ophthalmic: negative for photophobia, double vision or loss of vision Cardiovascular:  negative for chest pain, dyspnea on exertion, or new LE swelling Respiratory: negative for SOB or persistent cough Gastrointestinal: negative for abdominal pain, change in bowel habits or melena Genitourinary: negative for dysuria or gross hematuria, no abnormal uterine bleeding or disharge Musculoskeletal: negative for new gait disturbance or muscular weakness Integumentary: negative for new or persistent rashes, no breast lumps Neurological: negative for TIA or stroke symptoms Psychiatric: negative for SI or delusions Allergic/Immunologic: negative for hives  Patient Care Team    Relationship Specialty Notifications Start End  ALeamon Arnt MD PCP - General Family Medicine  10/07/18   MMegan Salon MD Consulting Physician Gynecology  10/07/18     Objective  Vitals: BP 92/80   Pulse 85   Temp 98.4 F (36.9 C)   Ht 5' 2.75" (1.594 m)   Wt 122 lb (55.3 kg)   LMP 10/01/2018   SpO2 96%   BMI 21.78 kg/m  General:  Well developed, well nourished, no acute distress  Psych:  Alert and orientedx3,normal mood and affect HEENT:  Normocephalic, atraumatic, non-icteric  sclera,  supple neck without adenopathy, mass or thyromegaly Cardiovascular:  Normal S1, S2, RRR without gallop, rub or murmur Respiratory:  Good breath sounds bilaterally, CTAB with normal respiratory effort Gastrointestinal: normal bowel sounds, soft, non-tender, no noted masses. No HSM MSK: no deformities, contusions. Joints are without erythema or swelling.  Skin:  Warm, no rashes or suspicious lesions noted Neurologic:    Mental status is normal. CN 2-11 are normal. Gross motor and sensory exams are normal. Normal gait. No tremor   Commons side effects, risks, benefits, and alternatives for medications and treatment plan prescribed today were discussed, and the patient expressed understanding of the given instructions. Patient is instructed to call or message via MyChart if he/she has any questions or concerns  regarding our treatment plan. No barriers to understanding were identified. We discussed Red Flag symptoms and signs in detail. Patient expressed understanding regarding what to do in case of urgent or emergency type symptoms.  Medication list was reconciled, printed and provided to the patient in AVS. Patient instructions and summary information was reviewed with the patient as documented in the AVS. This note was prepared with assistance of Dragon voice recognition software. Occasional wrong-word or sound-a-like substitutions may have occurred due to the inherent limitations of voice recognition software  This visit occurred during the SARS-CoV-2 public health emergency.  Safety protocols were in place, including screening questions prior to the visit, additional usage of staff PPE, and extensive cleaning of exam room while observing appropriate contact time as indicated for disinfecting solutions.

## 2022-03-21 LAB — COMPREHENSIVE METABOLIC PANEL
ALT: 13 U/L (ref 0–35)
AST: 25 U/L (ref 0–37)
Albumin: 4.4 g/dL (ref 3.5–5.2)
Alkaline Phosphatase: 48 U/L (ref 39–117)
BUN: 16 mg/dL (ref 6–23)
CO2: 27 mEq/L (ref 19–32)
Calcium: 10 mg/dL (ref 8.4–10.5)
Chloride: 104 mEq/L (ref 96–112)
Creatinine, Ser: 0.84 mg/dL (ref 0.40–1.20)
GFR: 80.24 mL/min (ref >60.00)
Glucose, Bld: 86 mg/dL (ref 70–99)
Potassium: 4.4 mEq/L (ref 3.5–5.1)
Sodium: 140 mEq/L (ref 135–145)
Total Bilirubin: 0.4 mg/dL (ref 0.2–1.2)
Total Protein: 7.2 g/dL (ref 6.0–8.3)

## 2022-03-21 LAB — CBC WITH DIFFERENTIAL/PLATELET
Basophils Absolute: 0.1 10*3/uL (ref 0.0–0.1)
Basophils Relative: 0.9 % (ref 0.0–3.0)
Eosinophils Absolute: 0.1 10*3/uL (ref 0.0–0.7)
Eosinophils Relative: 1.6 % (ref 0.0–5.0)
HCT: 38.5 % (ref 36.0–46.0)
Hemoglobin: 12.7 g/dL (ref 12.0–15.0)
Lymphocytes Relative: 21.8 % (ref 12.0–46.0)
Lymphs Abs: 1.2 10*3/uL (ref 0.7–4.0)
MCHC: 32.9 g/dL (ref 30.0–36.0)
MCV: 82.5 fl (ref 78.0–100.0)
Monocytes Absolute: 0.5 10*3/uL (ref 0.1–1.0)
Monocytes Relative: 8.1 % (ref 3.0–12.0)
Neutro Abs: 3.8 10*3/uL (ref 1.4–7.7)
Neutrophils Relative %: 67.6 % (ref 43.0–77.0)
Platelets: 265 10*3/uL (ref 150.0–400.0)
RBC: 4.67 Mil/uL (ref 3.87–5.11)
RDW: 14.2 % (ref 11.5–15.5)
WBC: 5.7 10*3/uL (ref 4.0–10.5)

## 2022-03-21 LAB — LIPID PANEL
Cholesterol: 233 mg/dL — ABNORMAL HIGH (ref 0–200)
HDL: 61.2 mg/dL (ref >39.00)
LDL Cholesterol: 158 mg/dL — ABNORMAL HIGH (ref 0–99)
NonHDL: 172.16
Total CHOL/HDL Ratio: 4
Triglycerides: 73 mg/dL (ref 0.0–149.0)
VLDL: 14.6 mg/dL (ref 0.0–40.0)

## 2022-03-21 LAB — HEPATITIS C ANTIBODY: Hepatitis C Ab: NONREACTIVE

## 2022-03-21 LAB — TSH: TSH: 1.17 u[IU]/mL (ref 0.35–5.50)

## 2022-04-04 ENCOUNTER — Other Ambulatory Visit (HOSPITAL_COMMUNITY)
Admission: RE | Admit: 2022-04-04 | Discharge: 2022-04-04 | Disposition: A | Discharge status: Home / Self Care | Payer: 59 | Source: Ambulatory Visit | Attending: Obstetrics & Gynecology | Admitting: Obstetrics & Gynecology

## 2022-04-04 ENCOUNTER — Encounter (HOSPITAL_BASED_OUTPATIENT_CLINIC_OR_DEPARTMENT_OTHER): Payer: Self-pay | Admitting: Obstetrics & Gynecology

## 2022-04-04 ENCOUNTER — Ambulatory Visit (INDEPENDENT_AMBULATORY_CARE_PROVIDER_SITE_OTHER): Payer: 59 | Admitting: Obstetrics & Gynecology

## 2022-04-04 VITALS — BP 118/72 | HR 62 | Ht 62.0 in | Wt 123.8 lb

## 2022-04-04 DIAGNOSIS — Z01419 Encounter for gynecological examination (general) (routine) without abnormal findings: Secondary | ICD-10-CM

## 2022-04-04 DIAGNOSIS — R760 Raised antibody titer: Secondary | ICD-10-CM

## 2022-04-04 DIAGNOSIS — Z124 Encounter for screening for malignant neoplasm of cervix: Secondary | ICD-10-CM | POA: Diagnosis present

## 2022-04-04 DIAGNOSIS — N951 Menopausal and female climacteric states: Secondary | ICD-10-CM | POA: Diagnosis not present

## 2022-04-04 DIAGNOSIS — G43109 Migraine with aura, not intractable, without status migrainosus: Secondary | ICD-10-CM

## 2022-04-04 NOTE — Patient Instructions (Signed)
Veozah.  New medication for hot flashes.

## 2022-04-04 NOTE — Progress Notes (Signed)
51 y.o. [name] Married White or Caucasian female here for annual exam.  Doing well.  Still having hot flashes but this has improved.  We discussed Veozah.  Doesn't think she wants to try anything at this time.    Working from home but still with [employer].  Has gotten a flat walking mat as she feels she is eating more with working from home.  Can walk several miles in a day.  Denies vaginal bleeding.  Patient's last menstrual period was 10/01/2018.          Sexually active: Yes.    The current method of family planning is vasectomy.    Exercising: Yes.     Smoker:  no  Health Maintenance: Pap:  04/30/2018 Negative History of abnormal Pap:  no MMG:  01/16/2022 Negative Colonoscopy:  03/15/2021, hyperplastic polyp, follow up 10 years Screening Labs: done 03/20/2022   reports that she has never smoked. She has never used smokeless tobacco. She reports that she does not currently use alcohol. She reports that she does not use drugs.  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 60 tablet 0   [START ON 04/19/2022] methylphenidate (RITALIN) 20 MG tablet Take 1 tablet (20 mg total) by mouth in the morning and at bedtime. 60 tablet 0   [START ON 05/19/2022] methylphenidate (RITALIN) 20 MG tablet Take 1 tablet (20 mg total) by mouth in the morning and at  bedtime. 60 tablet 0   No current facility-administered medications for this visit.    Family History  Problem Relation Age of Onset   Endometriosis Mother        hysterectomy age 74   Lymphoma Mother    Stroke Father    Basal cell carcinoma Father        skin   Colon polyps Maternal Grandmother    Diabetes Maternal Grandmother    Colon cancer Maternal Grandmother        70's   Hypertension Maternal Grandmother    Stroke Daughter        in utero   Learning disabilities Daughter    ADD / ADHD Daughter    Rectal cancer Other    Stomach cancer Other    Esophageal cancer Other     ROS: Constitutional: negative Genitourinary:negative  Exam:   BP 118/72 (BP Location: Right Arm, Patient Position: Sitting, Cuff Size: Normal)   Pulse 62   Ht '5\' 2"'$  (1.575 m) Comment: reported  Wt 123 lb 12.8 oz (56.2 kg)   LMP 10/01/2018   BMI 22.64 kg/m   Height: '5\' 2"'$  (157.5 cm) (reported)  General appearance: alert, cooperative and appears stated age Head: Normocephalic, without obvious abnormality, atraumatic Neck: no adenopathy, supple, symmetrical, trachea midline and thyroid normal to inspection  and palpation Lungs: clear to auscultation bilaterally Breasts: normal appearance, no masses or tenderness Heart: regular rate and rhythm Abdomen: soft, non-tender; bowel sounds normal; no masses,  no organomegaly Extremities: extremities normal, atraumatic, no cyanosis or edema Skin: Skin color, texture, turgor normal. No rashes or lesions Lymph nodes: Cervical, supraclavicular, and axillary nodes normal. No abnormal inguinal nodes palpated Neurologic: Grossly normal   Pelvic: External genitalia:  no lesions              Urethra:  normal appearing urethra with no masses, tenderness or lesions              Bartholins and Skenes: normal                 Vagina: normal appearing vagina with normal color and no discharge, no lesions              Cervix: no lesions              Pap taken:  Yes.   Bimanual Exam:  Uterus:  normal size, contour, position, consistency, mobility, non-tender              Adnexa: normal adnexa and no mass, fullness, tenderness               Rectovaginal: Confirms               Anus:  normal sphincter tone, no lesions  Chaperone, Octaviano Batty, CMA, was present for exam.  Assessment/Plan: 1. Well woman exam with routine gynecological exam - Pap smear with HR HPV obtained today - Mammogram 01/2022 - Colonoscopy 03/2021.  Follow up 10 years. - Bone mineral density not indicated at this time. - lab work done done 03/20/2022 - vaccines reviewed  2. Cervical cancer screening - Cytology - PAP( [facility])  3. Ocular migraine  4. Menopausal symptoms -improved this year.  New medication discussed.  Pt will likely not start anything new at this time.  5. Antiphospholipid antibody positive

## 2022-04-10 LAB — CYTOLOGY - PAP
Adequacy: ABSENT
Comment: NEGATIVE
Diagnosis: UNDETERMINED — AB
High risk HPV: NEGATIVE

## 2022-06-03 ENCOUNTER — Encounter: Payer: Self-pay | Admitting: *Deleted

## 2022-08-22 ENCOUNTER — Encounter: Payer: Self-pay | Admitting: *Deleted

## 2022-09-25 ENCOUNTER — Ambulatory Visit (INDEPENDENT_AMBULATORY_CARE_PROVIDER_SITE_OTHER): Payer: Commercial Managed Care - PPO | Admitting: Family Medicine

## 2022-09-25 VITALS — BP 96/60 | HR 80 | Temp 98.0°F | Ht 62.0 in | Wt 122.2 lb

## 2022-09-25 DIAGNOSIS — F338 Other recurrent depressive disorders: Secondary | ICD-10-CM | POA: Diagnosis not present

## 2022-09-25 DIAGNOSIS — F902 Attention-deficit hyperactivity disorder, combined type: Secondary | ICD-10-CM | POA: Diagnosis not present

## 2022-09-25 MED ORDER — AMPHET-DEXTROAMPHET 3-BEAD ER 25 MG PO CP24: 25.0000 mg | ORAL_CAPSULE | Freq: Every day | ORAL | 0 refills | Status: DC

## 2022-09-25 NOTE — Progress Notes (Signed)
Subjective  CC:  Chief Complaint  Patient presents with   ADHD    HPI: Karen Lozano is a 52 y.o. female who presents to the office today to address the problems listed above in the chief complaint.  Patient is here today for follow up of ADD/ADHD. Continues to struggle a bit. Work is very busy and mood is low due to winter and less sunlight/exercise etc. Focus is poor overall. Ritalin '20mg'$  no longer working as significantly and lasting only about 2 hours. If takes 2nd dose, doesn't feel it helps much at all. Still with some insomnia if takes at night. Has failed adderall, and strattera in the past.   This patient does not have contraindications for stimulant use include hypertension, tachycardia, arrhythmia, psychosis, bipolar disorder, severe anorexia, and Tourette syndrome.  Assessment  1. Attention deficit hyperactivity disorder (ADHD), combined type   2. Seasonal affective disorder ([place])      Plan  ADD:  we discussed options. Ordered mydais but not covered on her insurance. Will either stick with ritalin or try intuniv. Waiting on pt's preference.  SAD: affects mood and focus. Should improve in spring but needs to be vigilant through winte.r  Follow up: July for cpe  No orders of the defined types were placed in this encounter.  Meds ordered this encounter  Medications   Amphet-Dextroamphet 3-Bead ER (MYDAYIS) 25 MG CP24    Sig: Take 25 mg by mouth daily.    Dispense:  30 capsule    Refill:  0      I reviewed the patients updated PMH, FH, and SocHx.    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   [DISCONTINUED]  methylphenidate (RITALIN) 20 MG tablet Take 1 tablet (20 mg total) by mouth in the morning and at bedtime.   [DISCONTINUED] methylphenidate (RITALIN) 20 MG tablet Take 1 tablet (20 mg total) by mouth in the morning and at bedtime.   [DISCONTINUED] methylphenidate (RITALIN) 20 MG tablet Take 1 tablet (20 mg total) by mouth in the morning and at bedtime.    Allergies: Patient has No Known Allergies. Family History: Patient family history includes ADD / ADHD in her daughter; Basal cell carcinoma in her father; Colon cancer in her maternal grandmother; Colon polyps in her maternal grandmother; Diabetes in her maternal grandmother; Endometriosis in her mother; Esophageal cancer in an other family member; Hypertension in her maternal grandmother; Learning disabilities in her daughter; Lymphoma in her mother; Rectal cancer in an other family member; Stomach cancer in an other family member; Stroke in her daughter and father. Social History:  Patient  reports that she has never smoked. She has never used smokeless tobacco. She reports that she does not currently use alcohol. She reports that she does not use drugs.  Review of Systems: Constitutional: Negative for fever malaise or anorexia Cardiovascular: negative for chest pain Respiratory: negative for SOB or persistent cough Gastrointestinal: negative for abdominal pain  Objective  Vitals: BP 96/60   Pulse 80   Temp 98 F (36.7 C)   Ht '5\' 2"'$  (1.575 m)   Wt 122 lb 3.2 oz (55.4 kg)   LMP  10/01/2018   SpO2 98%   BMI 22.35 kg/m  General: no acute distress , A&Ox3   Commons side effects, risks, benefits, and alternatives for medications and treatment plan prescribed today were discussed, and the patient expressed understanding of the given instructions. Patient is instructed to call or message via MyChart if he/she has any questions or concerns regarding our treatment plan. No barriers to understanding were identified. We discussed Red Flag  symptoms and signs in detail. Patient expressed understanding regarding what to do in case of urgent or emergency type symptoms.  Medication list was reconciled, printed and provided to the patient in AVS. Patient instructions and summary information was reviewed with the patient as documented in the AVS. This note was prepared with assistance of Dragon voice recognition software. Occasional wrong-word or sound-a-like substitutions may have occurred due to the inherent limitations of voice recognition software

## 2022-09-25 NOTE — Patient Instructions (Signed)
Please return in 3 months for ADD  Let me know via Mychart how the [place] does for you; if it is better, I will refill. If not, can try another forumuation.  If you have any questions or concerns, please don't hesitate to send me a message via MyChart or call the office at [phone]. Thank you for visiting with [place] today! It's our pleasure caring for you.

## 2022-09-26 ENCOUNTER — Encounter (HOSPITAL_BASED_OUTPATIENT_CLINIC_OR_DEPARTMENT_OTHER): Payer: Self-pay | Admitting: Obstetrics & Gynecology

## 2022-09-26 ENCOUNTER — Telehealth: Payer: Self-pay

## 2022-09-26 NOTE — Telephone Encounter (Signed)
Received fax from pharmacy stating that [place]  ER '25mg'$  capsule is not covered.  They did not say that it needs a PA just says not covered.  Please Advise

## 2022-09-27 NOTE — Telephone Encounter (Signed)
Message sent to MyChart to let her know that mydais is not covered. Waiting on a response

## 2022-10-21 DIAGNOSIS — N3001 Acute cystitis with hematuria: Secondary | ICD-10-CM | POA: Diagnosis not present

## 2022-12-25 ENCOUNTER — Ambulatory Visit (INDEPENDENT_AMBULATORY_CARE_PROVIDER_SITE_OTHER): Payer: Commercial Managed Care - PPO | Admitting: Family Medicine

## 2022-12-25 ENCOUNTER — Encounter: Payer: Self-pay | Admitting: Family Medicine

## 2022-12-25 VITALS — BP 110/64 | HR 100 | Temp 98.0°F | Ht 62.0 in | Wt 116.2 lb

## 2022-12-25 DIAGNOSIS — F902 Attention-deficit hyperactivity disorder, combined type: Secondary | ICD-10-CM

## 2022-12-25 DIAGNOSIS — F338 Other recurrent depressive disorders: Secondary | ICD-10-CM | POA: Diagnosis not present

## 2022-12-25 MED ORDER — AMPHET-DEXTROAMPHET 3-BEAD ER 25 MG PO CP24: 25.0000 mg | ORAL_CAPSULE | Freq: Every day | ORAL | 0 refills | Status: DC

## 2022-12-25 MED ORDER — AMPHET-DEXTROAMPHET 3-BEAD ER 25 MG PO CP24
25.0000 mg | ORAL_CAPSULE | Freq: Every day | ORAL | 0 refills | Status: AC
Start: 2023-04-24 — End: ?

## 2022-12-25 MED ORDER — AMPHET-DEXTROAMPHET 3-BEAD ER 25 MG PO CP24
25.0000 mg | ORAL_CAPSULE | Freq: Every day | ORAL | 0 refills | Status: AC
Start: 2023-02-23 — End: ?

## 2022-12-25 NOTE — Progress Notes (Signed)
Subjective  CC:  Chief Complaint  Patient presents with   ADHD    HPI: Karen Lozano is a 52 y.o. female who presents to the office today to address the problems listed above in the chief complaint.  Patient is here today for follow up of ADD/ADHD.  Started new medication Mydayis about 3 months ago.  Fortunately has done very well for her.  Beneficial to help her keep focused, completing tasks and working throughout her long workdays.  Unfortunately having some insomnia.  No other adverse side effects.  Has used melatonin in the past but has not found it to be helpful.  Not interested in other sleep medicines at this time.  Mood remains stable.  Assessment  1. Attention deficit hyperactivity disorder (ADHD), combined type   2. Seasonal affective disorder      Plan  ADD: Continue Mydayis.  Discussed behavioral management strategies for insomnia.  At this time she will monitor, tried melatonin follow-up if needed.  Monitor weight.  Patient does admit to eating less but did not feel that she lost weight.  Educated on proper nutrition Seasonal affective disorder: Mood is improving with screening.  Follow up: 3 to 6 months for complete physical and follow-up ADD No orders of the defined types were placed in this encounter.  Meds ordered this encounter  Medications   Amphet-Dextroamphet 3-Bead ER (MYDAYIS) 25 MG CP24    Sig: Take 1 capsule (25 mg total) by mouth daily.    Dispense:  30 capsule    Refill:  0   Amphet-Dextroamphet 3-Bead ER (MYDAYIS) 25 MG CP24    Sig: Take 1 capsule (25 mg total) by mouth daily.    Dispense:  30 capsule    Refill:  0   Amphet-Dextroamphet 3-Bead ER (MYDAYIS) 25 MG CP24    Sig: Take 1 capsule (25 mg total) by mouth daily.    Dispense:  30 capsule    Refill:  0   Amphet-Dextroamphet 3-Bead ER (MYDAYIS) 25 MG CP24    Sig: Take 1 capsule (25 mg total) by mouth daily.    Dispense:  30 capsule    Refill:  0   Amphet-Dextroamphet 3-Bead ER (MYDAYIS)  25 MG CP24    Sig: Take 1 capsule (25 mg total) by mouth daily.    Dispense:  30 capsule    Refill:  0   Amphet-Dextroamphet 3-Bead ER (MYDAYIS) 25 MG CP24    Sig: Take 1 capsule (25 mg total) by mouth daily.    Dispense:  30 capsule    Refill:  0      I reviewed the patients updated PMH, FH, and SocHx.    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    Allergies: Patient has No Known Allergies. Family History: Patient family history includes ADD / ADHD in her daughter; Basal cell carcinoma in her father; Colon cancer in her maternal grandmother; Colon polyps in her maternal grandmother; Diabetes in her maternal grandmother; Endometriosis in her mother; Esophageal cancer in an other family member; Hypertension in her maternal grandmother; Learning disabilities in her daughter; Lymphoma in her mother; Rectal cancer in an  other family member; Stomach cancer in an other family member; Stroke in her daughter and father. Social History:  Patient  reports that she has never smoked. She has never used smokeless tobacco. She reports that she does not currently use alcohol. She reports that she does not use drugs.  Review of Systems: Constitutional: Negative for fever malaise or anorexia Cardiovascular: negative for chest pain Respiratory: negative for SOB or persistent cough Gastrointestinal: negative for abdominal pain  Objective  Vitals: BP 110/64   Pulse 100   Temp 98 F (36.7 C)   Ht  (1.575 m)   Wt 116 lb 3.2 oz (52.7 kg)   LMP 10/01/2018   SpO2 97%   BMI 21.25 kg/m  General: no acute distress , A&Ox3 Wt  Readings from Last 3 Encounters:  12/25/22 116 lb 3.2 oz (52.7 kg)  09/25/22 122 lb 3.2 oz (55.4 kg)  04/04/22 123 lb 12.8 oz (56.2 kg)     Commons side effects, risks, benefits, and alternatives for medications and treatment plan prescribed today were discussed, and the patient expressed understanding of the given instructions. Patient is instructed to call or message via MyChart if he/she has any questions or concerns regarding our treatment plan. No barriers to understanding were identified. We discussed Red Flag symptoms and signs in detail. Patient expressed understanding regarding what to do in case of urgent or emergency type symptoms.  Medication list was reconciled, printed and provided to the patient in AVS. Patient instructions and summary information was reviewed with the patient as documented in the AVS. This note was prepared with assistance of Dragon voice recognition software. Occasional wrong-word or sound-a-like substitutions may have occurred due to the inherent limitations of voice recognition software

## 2022-12-25 NOTE — Patient Instructions (Signed)
Please return in 3-6 months for your annual complete physical; please come fasting.   I will refill your medications for 6 months.   If you have any questions or concerns, please don't hesitate to send me a message via MyChart or call the office at [phone]. Thank you for visiting with Karen Lozano today! It's our pleasure caring for you.

## 2023-01-20 ENCOUNTER — Other Ambulatory Visit: Payer: Self-pay | Admitting: Family Medicine

## 2023-01-20 NOTE — Telephone Encounter (Signed)
RX: Amphet-Dextroamphet 3-Bead ER (MYDAYIS) 25 MG CP24   needs to be changed to different pharmacy due to insurance.   [facility] Phone: [phone]  Fax: [phone]

## 2023-01-21 ENCOUNTER — Other Ambulatory Visit (HOSPITAL_BASED_OUTPATIENT_CLINIC_OR_DEPARTMENT_OTHER): Payer: Self-pay

## 2023-01-21 MED ORDER — AMPHET-DEXTROAMPHET 3-BEAD ER 25 MG PO CP24
25.0000 mg | ORAL_CAPSULE | Freq: Every day | ORAL | 0 refills | Status: DC
  Filled 2023-01-24: qty 30, 30d supply, fill #0

## 2023-01-24 ENCOUNTER — Other Ambulatory Visit (HOSPITAL_BASED_OUTPATIENT_CLINIC_OR_DEPARTMENT_OTHER): Payer: Self-pay

## 2023-01-24 ENCOUNTER — Other Ambulatory Visit: Payer: Self-pay

## 2023-01-28 ENCOUNTER — Other Ambulatory Visit (HOSPITAL_BASED_OUTPATIENT_CLINIC_OR_DEPARTMENT_OTHER): Payer: Self-pay

## 2023-02-16 DIAGNOSIS — R300 Dysuria: Secondary | ICD-10-CM | POA: Diagnosis not present

## 2023-02-16 DIAGNOSIS — N390 Urinary tract infection, site not specified: Secondary | ICD-10-CM | POA: Diagnosis not present

## 2023-03-31 ENCOUNTER — Telehealth: Payer: Self-pay | Admitting: Family Medicine

## 2023-03-31 NOTE — Telephone Encounter (Signed)
Prescription Request  03/31/2023  LOV: 12/25/2022  What is the name of the medication or equipment?  Amphet-Dextroamphet 3-Bead ER (MYDAYIS) 25 MG CP24   Have you contacted your pharmacy to request a refill? Yes   Which pharmacy would you like this sent to? [facility] [address] [place] [address] Phone: [phone] Fax: [phone]   Patient notified that their request is being sent to the clinical staff for review and that they should receive a response within 2 business days.   Please advise at Mobile [phone] (mobile)

## 2023-04-01 NOTE — Telephone Encounter (Signed)
LVM for pt to contact pharmacy bc there should still be refills there.

## 2023-04-04 ENCOUNTER — Other Ambulatory Visit (HOSPITAL_BASED_OUTPATIENT_CLINIC_OR_DEPARTMENT_OTHER): Payer: Self-pay

## 2023-04-04 ENCOUNTER — Other Ambulatory Visit: Payer: Self-pay | Admitting: Family Medicine

## 2023-04-04 MED ORDER — AMPHET-DEXTROAMPHET 3-BEAD ER 25 MG PO CP24
25.0000 mg | ORAL_CAPSULE | Freq: Every day | ORAL | 0 refills | Status: DC
  Filled 2023-04-04: qty 30, 30d supply, fill #0

## 2023-04-04 NOTE — Telephone Encounter (Signed)
LOV: 12/25/2022  Last Fill Date: 02/23/2023  Qty: 30  Refills: 0

## 2023-04-05 ENCOUNTER — Other Ambulatory Visit (HOSPITAL_BASED_OUTPATIENT_CLINIC_OR_DEPARTMENT_OTHER): Payer: Self-pay

## 2023-04-07 ENCOUNTER — Other Ambulatory Visit (HOSPITAL_BASED_OUTPATIENT_CLINIC_OR_DEPARTMENT_OTHER): Payer: Self-pay

## 2023-04-16 ENCOUNTER — Encounter (HOSPITAL_BASED_OUTPATIENT_CLINIC_OR_DEPARTMENT_OTHER): Payer: Self-pay | Admitting: Obstetrics & Gynecology

## 2023-05-22 ENCOUNTER — Other Ambulatory Visit: Payer: Self-pay | Admitting: Family Medicine

## 2023-05-22 ENCOUNTER — Other Ambulatory Visit (HOSPITAL_BASED_OUTPATIENT_CLINIC_OR_DEPARTMENT_OTHER): Payer: Self-pay

## 2023-05-28 ENCOUNTER — Other Ambulatory Visit (HOSPITAL_BASED_OUTPATIENT_CLINIC_OR_DEPARTMENT_OTHER): Payer: Self-pay

## 2023-05-28 ENCOUNTER — Other Ambulatory Visit: Payer: Self-pay | Admitting: Family Medicine

## 2023-05-29 ENCOUNTER — Other Ambulatory Visit (HOSPITAL_BASED_OUTPATIENT_CLINIC_OR_DEPARTMENT_OTHER): Payer: Self-pay

## 2023-05-29 ENCOUNTER — Other Ambulatory Visit: Payer: Self-pay

## 2023-05-29 MED ORDER — AMPHET-DEXTROAMPHET 3-BEAD ER 25 MG PO CP24
25.0000 mg | ORAL_CAPSULE | Freq: Every day | ORAL | 0 refills | Status: DC
  Filled 2023-05-29: qty 30, 30d supply, fill #0

## 2023-06-10 ENCOUNTER — Ambulatory Visit (INDEPENDENT_AMBULATORY_CARE_PROVIDER_SITE_OTHER): Payer: Commercial Managed Care - PPO | Admitting: Family Medicine

## 2023-06-10 ENCOUNTER — Encounter: Payer: Self-pay | Admitting: Family Medicine

## 2023-06-10 ENCOUNTER — Other Ambulatory Visit (HOSPITAL_BASED_OUTPATIENT_CLINIC_OR_DEPARTMENT_OTHER): Payer: Self-pay

## 2023-06-10 ENCOUNTER — Other Ambulatory Visit: Payer: Self-pay | Admitting: Family Medicine

## 2023-06-10 VITALS — BP 110/70 | HR 89 | Temp 97.8°F | Ht 62.0 in | Wt 116.4 lb

## 2023-06-10 DIAGNOSIS — Z23 Encounter for immunization: Secondary | ICD-10-CM | POA: Diagnosis not present

## 2023-06-10 DIAGNOSIS — R3915 Urgency of urination: Secondary | ICD-10-CM

## 2023-06-10 DIAGNOSIS — Z1231 Encounter for screening mammogram for malignant neoplasm of breast: Secondary | ICD-10-CM

## 2023-06-10 DIAGNOSIS — Z0001 Encounter for general adult medical examination with abnormal findings: Secondary | ICD-10-CM

## 2023-06-10 DIAGNOSIS — G43109 Migraine with aura, not intractable, without status migrainosus: Secondary | ICD-10-CM | POA: Diagnosis not present

## 2023-06-10 DIAGNOSIS — F902 Attention-deficit hyperactivity disorder, combined type: Secondary | ICD-10-CM | POA: Diagnosis not present

## 2023-06-10 DIAGNOSIS — Z0000 Encounter for general adult medical examination without abnormal findings: Secondary | ICD-10-CM | POA: Diagnosis not present

## 2023-06-10 DIAGNOSIS — F338 Other recurrent depressive disorders: Secondary | ICD-10-CM | POA: Diagnosis not present

## 2023-06-10 LAB — COMPREHENSIVE METABOLIC PANEL
ALT: 13 U/L (ref 0–35)
AST: 19 U/L (ref 0–37)
Albumin: 4.4 g/dL (ref 3.5–5.2)
Alkaline Phosphatase: 52 U/L (ref 39–117)
BUN: 12 mg/dL (ref 6–23)
CO2: 32 meq/L (ref 19–32)
Calcium: 10.3 mg/dL (ref 8.4–10.5)
Chloride: 101 meq/L (ref 96–112)
Creatinine, Ser: 0.82 mg/dL (ref 0.40–1.20)
GFR: 81.88 mL/min (ref >60.00)
Glucose, Bld: 80 mg/dL (ref 70–99)
Potassium: 4.1 meq/L (ref 3.5–5.1)
Sodium: 141 meq/L (ref 135–145)
Total Bilirubin: 0.6 mg/dL (ref 0.2–1.2)
Total Protein: 7.4 g/dL (ref 6.0–8.3)

## 2023-06-10 LAB — LIPID PANEL
Cholesterol: 232 mg/dL — ABNORMAL HIGH (ref 0–200)
HDL: 63.6 mg/dL (ref >39.00)
LDL Cholesterol: 152 mg/dL — ABNORMAL HIGH (ref 0–99)
NonHDL: 168.31
Total CHOL/HDL Ratio: 4
Triglycerides: 84 mg/dL (ref 0.0–149.0)
VLDL: 16.8 mg/dL (ref 0.0–40.0)

## 2023-06-10 LAB — CBC WITH DIFFERENTIAL/PLATELET
Basophils Absolute: 0.1 10*3/uL (ref 0.0–0.1)
Basophils Relative: 1.4 % (ref 0.0–3.0)
Eosinophils Absolute: 0.1 10*3/uL (ref 0.0–0.7)
Eosinophils Relative: 2 % (ref 0.0–5.0)
HCT: 41.7 % (ref 36.0–46.0)
Hemoglobin: 13.2 g/dL (ref 12.0–15.0)
Lymphocytes Relative: 26.1 % (ref 12.0–46.0)
Lymphs Abs: 1.5 10*3/uL (ref 0.7–4.0)
MCHC: 31.7 g/dL (ref 30.0–36.0)
MCV: 82.5 fL (ref 78.0–100.0)
Monocytes Absolute: 0.5 10*3/uL (ref 0.1–1.0)
Monocytes Relative: 9.3 % (ref 3.0–12.0)
Neutro Abs: 3.5 10*3/uL (ref 1.4–7.7)
Neutrophils Relative %: 61.2 % (ref 43.0–77.0)
Platelets: 311 10*3/uL (ref 150.0–400.0)
RBC: 5.05 Mil/uL (ref 3.87–5.11)
RDW: 14.2 % (ref 11.5–15.5)
WBC: 5.8 10*3/uL (ref 4.0–10.5)

## 2023-06-10 LAB — TSH: TSH: 1.76 u[IU]/mL (ref 0.35–5.50)

## 2023-06-10 LAB — VITAMIN D 25 HYDROXY (VIT D DEFICIENCY, FRACTURES): VITD: 43.4 ng/mL (ref 30.00–100.00)

## 2023-06-10 MED ORDER — AMPHET-DEXTROAMPHET 3-BEAD ER 25 MG PO CP24: 25.0000 mg | ORAL_CAPSULE | Freq: Every day | ORAL | 0 refills | Status: DC

## 2023-06-10 MED ORDER — AMPHET-DEXTROAMPHET 3-BEAD ER 25 MG PO CP24
25.0000 mg | ORAL_CAPSULE | Freq: Every day | ORAL | 0 refills | Status: DC
  Filled 2023-06-10 – 2023-10-17 (×2): qty 30, 30d supply, fill #0

## 2023-06-10 MED ORDER — AMPHET-DEXTROAMPHET 3-BEAD ER 25 MG PO CP24
25.0000 mg | ORAL_CAPSULE | Freq: Every day | ORAL | 0 refills | Status: DC
  Filled 2023-08-18: qty 20, 20d supply, fill #0
  Filled 2023-08-18: qty 30, 30d supply, fill #0

## 2023-06-10 NOTE — Progress Notes (Signed)
Subjective  Chief Complaint  Patient presents with   Annual Exam    Pt here for Annual Exam and is currently not fasting. Pt has not had mammogram   ADHD    HPI: Karen Lozano is a 52 y.o. female who presents to [facility] today for a Female Wellness Visit. She also has the concerns and/or needs as listed above in the chief complaint. These will be addressed in addition to the Health Maintenance Visit.   Wellness Visit: annual visit with health maintenance review and exam  Health maintenance: Sees GYN, Pap smear up-to-date.  Reviewed results, ASCUS Pap with negative HPV, repeat in 3 years per Dr. Hyacinth Meeker.  Overdue for mammogram.  Patient will schedule a breast center.  Just needs to make that time.  Flu shot appointment upcoming and gets at work.  She thinks her Tdap is up-to-date and she will check with [place] at works.  Healthy lifestyle.  Shingrix eligible today. Chronic disease f/u and/or acute problem visit: (deemed necessary to be done in addition to the wellness visit): ADD/ADHD  follow up:  She is taking medication as directed and continues to feel it is beneficial. The medications continue to help with focus and attention and task completion. She denies adverse side effects; specifically no headaches, appetite suppression, weight loss, sleeping difficulty, heart palpitations, chest pain or significant weight changes. Takes at 5:30 am and lasts till about 2pm. Working really well!! No recent migraines Seasonal affective disorder: Has had a great summer.  No current mood symptoms Complains of urinary urgency without dysuria over the last week or so.  Has increased fluid intake.  No dysuria, hematuria or vaginal discharge.  Assessment  1. Encounter for well adult exam with abnormal findings   2. Need for shingles vaccine   3. Attention deficit hyperactivity disorder (ADHD), combined type   4. Ocular migraine   5. Seasonal affective disorder (HCC)   6.  Urinary urgency      Plan  Female Wellness Visit: Age appropriate Health Maintenance and Prevention measures were discussed with patient. Included topics are cancer screening recommendations, ways to keep healthy (see AVS) including dietary and exercise recommendations, regular eye and dental care, use of seat belts, and avoidance of moderate alcohol use and tobacco use.  Patient to schedule mammogram BMI: discussed patient's BMI and encouraged positive lifestyle modifications to help get to or maintain a target BMI. HM needs and immunizations were addressed and ordered. See below for orders. See HM and immunization section for updates.  Shingrix No. 1 given today, will return for second dose in 6 months Routine labs and screening tests ordered including cmp, cbc and lipids where appropriate. Discussed recommendations regarding Vit D and calcium supplementation (see AVS)  Chronic disease management visit and/or acute problem visit: ADD: Finally very well-controlled on current medications. Mydayis refilled for 6 months.  Weight is stable.  Sleep is good if she takes it early Monitor for migraines.  Currently rare Seasonal affective disorder: Will work on behavioral changes as the following winter months approach. Urinary urgency: Rule out infection with urine culture.  Increase water.  Decrease Mountain Dew/caffeine.  Follow up: 6 months for ADD follow-up and second Shingrix vaccination Orders Placed This Encounter  Procedures   Urine Culture   Zoster Recombinant (Shingrix )   VITAMIN D 25 Hydroxy (Vit-D Deficiency, Fractures)   CBC with Differential/Platelet   Comprehensive metabolic panel   Lipid panel   TSH  No orders of the defined types were placed in this encounter.     Body mass index is 21.29 kg/m. 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 Adderall, ritalin. Trial of stratterra initiated 09/2018; trial of mydais 09/2022    Seasonal affective disorder (HCC) 10/07/2018     Tried lexapro and one other SSRI: depressed libido and couldn't sleep    Migraine headache 01/19/2016    Antiphospholipid antibody positive 03/16/2015    Health Maintenance  Topic Date Due   DTaP/Tdap/Td (1 - Tdap) Never done   MAMMOGRAM  01/17/2023   COVID-19 Vaccine (1 - 2023-24 season) 06/26/2023 (Originally 05/11/2023)   INFLUENZA VACCINE  12/08/2023 (Originally 04/10/2023)   Zoster Vaccines- Shingrix (2 of 2) 08/05/2023   Cervical Cancer Screening (HPV/Pap Cotest)  04/05/2027   Colonoscopy  03/16/2031   Hepatitis C Screening  Completed   HIV Screening  Completed   HPV VACCINES  Aged Out   Immunization History  Administered Date(s) Administered   Influenza,inj,Quad PF,6+ Mos 06/23/2022   Influenza-Unspecified 07/19/2020   Zoster Recombinant(Shingrix) 06/10/2023   We updated and reviewed the patient's past history in detail and it is documented below. Allergies: Patient has No Known Allergies. Past Medical History Patient  has a past medical history of Anemia, Anticardiolipin antibody positive, Anxiety, History of anemia, Migraine, and Seasonal affective disorder (HCC) (10/07/2018). Past Surgical History Patient  has a past surgical history that includes Cesarean section; Tonsillectomy; and Wisdom tooth extraction. Family History: Patient family history includes ADD / ADHD in her daughter; Basal cell carcinoma in her father; Colon cancer in her maternal grandmother; Colon polyps in her maternal grandmother; Diabetes in her maternal grandmother;  Endometriosis in her mother; Esophageal cancer in an other family member; Hypertension in her maternal grandmother; Learning disabilities in her daughter; Lymphoma in her mother; Rectal cancer in an other family member; Stomach cancer in an other family member; Stroke in her daughter and father. Social History:  Patient  reports that she has never smoked. She has never used smokeless tobacco. She reports that she does not currently use alcohol. She reports that she does not use drugs.  Review of Systems: Constitutional: negative for fever or malaise Ophthalmic: negative for photophobia, double vision or loss of vision Cardiovascular: negative for chest pain, dyspnea on exertion, or new LE swelling Respiratory: negative for SOB or persistent cough Gastrointestinal: negative for abdominal pain, change in bowel habits or melena Genitourinary: negative for dysuria or gross hematuria, no abnormal uterine bleeding or disharge Musculoskeletal: negative for new gait disturbance or muscular weakness Integumentary: negative for new or persistent rashes, no breast lumps Neurological: negative for TIA or stroke symptoms Psychiatric: negative for SI or delusions Allergic/Immunologic: negative for hives  Patient Care Team    Relationship Specialty Notifications Start End  Willow Ora, MD PCP - General Family Medicine  10/07/18   Jerene Bears, MD Consulting Physician Gynecology  10/07/18     Objective  Vitals: BP 110/70   Pulse 89  Temp 97.8 F (36.6 C)   Ht 5\' 2"  (1.575 m)   Wt 116 lb 6.4 oz (52.8 kg)   LMP 10/01/2018   SpO2 96%   BMI 21.29 kg/m  General:  Well developed, well nourished, no acute distress  Psych:  Alert and orientedx3,normal mood and affect HEENT:  Normocephalic, atraumatic, non-icteric sclera,  supple neck without adenopathy, mass or thyromegaly Cardiovascular:  Normal S1, S2, RRR without gallop, rub or murmur Respiratory:  Good breath sounds bilaterally, CTAB with  normal respiratory effort Gastrointestinal: normal bowel sounds, soft, non-tender, no noted masses. No HSM MSK: extremities without edema, joints without erythema or swelling Neurologic:    Mental status is normal.  Gross motor and sensory exams are normal.  No tremor  Commons side effects, risks, benefits, and alternatives for medications and treatment plan prescribed today were discussed, and the patient expressed understanding of the given instructions. Patient is instructed to call or message via MyChart if he/she has any questions or concerns regarding our treatment plan. No barriers to understanding were identified. We discussed Red Flag symptoms and signs in detail. Patient expressed understanding regarding what to do in case of urgent or emergency type symptoms.  Medication list was reconciled, printed and provided to the patient in AVS. Patient instructions and summary information was reviewed with the patient as documented in the AVS. This note was prepared with assistance of Dragon voice recognition software. Occasional wrong-word or sound-a-like substitutions may have occurred due to the inherent limitations of voice recognition software

## 2023-06-10 NOTE — Patient Instructions (Signed)
Please return in 6 months for ADD and second Shingrix vaccination  I will release your lab results to you on your MyChart account with further instructions. You may see the results before I do, but when I review them I will send you a message with my report or have my assistant call you if things need to be discussed. Please reply to my message with any questions. Thank you!   If you have any questions or concerns, please don't hesitate to send me a message via MyChart or call the office at [phone]. Thank you for visiting with [place] today! It's our pleasure caring for you.

## 2023-06-11 ENCOUNTER — Ambulatory Visit
Admission: RE | Admit: 2023-06-11 | Discharge: 2023-06-11 | Disposition: A | Discharge status: Home / Self Care | Payer: Commercial Managed Care - PPO | Source: Ambulatory Visit | Attending: Family Medicine | Admitting: Family Medicine

## 2023-06-11 DIAGNOSIS — Z1231 Encounter for screening mammogram for malignant neoplasm of breast: Secondary | ICD-10-CM | POA: Diagnosis not present

## 2023-06-11 LAB — URINE CULTURE
MICRO NUMBER:: 15536424
Result:: NO GROWTH
SPECIMEN QUALITY:: ADEQUATE

## 2023-06-15 NOTE — Progress Notes (Signed)
Labs reviewed.  The 10-year ASCVD risk score (Arnett DK, et al., 2019) is: 1.2%   Values used to calculate the score:     Age: 52 years     Sex: Female     Is Non-Hispanic African American: No     Diabetic: No     Tobacco smoker: No     Systolic Blood Pressure: 110 mmHg     Is BP treated: No     HDL Cholesterol: 63.6 mg/dL     Total Cholesterol: 232 mg/dL

## 2023-08-12 ENCOUNTER — Ambulatory Visit (INDEPENDENT_AMBULATORY_CARE_PROVIDER_SITE_OTHER): Payer: Commercial Managed Care - PPO

## 2023-08-12 DIAGNOSIS — Z23 Encounter for immunization: Secondary | ICD-10-CM

## 2023-08-12 NOTE — Progress Notes (Signed)
.  Patient is in office today for a nurse visit for  2nd shingles vaccine per Dr. Mardelle Matte . Patient Injection was given in the  Right deltoid. Patient tolerated injection well.  Christy Gentles

## 2023-08-18 ENCOUNTER — Other Ambulatory Visit (HOSPITAL_BASED_OUTPATIENT_CLINIC_OR_DEPARTMENT_OTHER): Payer: Self-pay

## 2023-08-18 ENCOUNTER — Other Ambulatory Visit: Payer: Self-pay

## 2023-08-20 ENCOUNTER — Other Ambulatory Visit (HOSPITAL_BASED_OUTPATIENT_CLINIC_OR_DEPARTMENT_OTHER): Payer: Self-pay

## 2023-10-17 ENCOUNTER — Other Ambulatory Visit: Payer: Self-pay

## 2023-10-17 ENCOUNTER — Other Ambulatory Visit (HOSPITAL_BASED_OUTPATIENT_CLINIC_OR_DEPARTMENT_OTHER): Payer: Self-pay

## 2023-12-08 ENCOUNTER — Other Ambulatory Visit: Payer: Self-pay | Admitting: Family Medicine

## 2023-12-08 ENCOUNTER — Other Ambulatory Visit (HOSPITAL_BASED_OUTPATIENT_CLINIC_OR_DEPARTMENT_OTHER): Payer: Self-pay

## 2023-12-08 NOTE — Telephone Encounter (Signed)
 LOV 06/10/2023 Last refill 11/08/2023

## 2023-12-09 ENCOUNTER — Other Ambulatory Visit (HOSPITAL_BASED_OUTPATIENT_CLINIC_OR_DEPARTMENT_OTHER): Payer: Self-pay

## 2023-12-09 ENCOUNTER — Encounter (HOSPITAL_BASED_OUTPATIENT_CLINIC_OR_DEPARTMENT_OTHER): Payer: Self-pay

## 2023-12-12 ENCOUNTER — Other Ambulatory Visit (HOSPITAL_BASED_OUTPATIENT_CLINIC_OR_DEPARTMENT_OTHER): Payer: Self-pay

## 2023-12-12 ENCOUNTER — Telehealth: Admitting: Family Medicine

## 2023-12-12 ENCOUNTER — Other Ambulatory Visit: Payer: Self-pay

## 2023-12-12 ENCOUNTER — Encounter: Payer: Self-pay | Admitting: Family Medicine

## 2023-12-12 VITALS — Ht 62.0 in | Wt 120.0 lb

## 2023-12-12 DIAGNOSIS — F902 Attention-deficit hyperactivity disorder, combined type: Secondary | ICD-10-CM | POA: Diagnosis not present

## 2023-12-12 MED ORDER — AMPHET-DEXTROAMPHET 3-BEAD ER 25 MG PO CP24: 25.0000 mg | ORAL_CAPSULE | Freq: Every day | ORAL | 0 refills | Status: DC

## 2023-12-12 MED ORDER — AMPHET-DEXTROAMPHET 3-BEAD ER 25 MG PO CP24
25.0000 mg | ORAL_CAPSULE | Freq: Every day | ORAL | 0 refills | Status: DC
  Filled 2024-02-12: qty 30, 30d supply, fill #0

## 2023-12-12 MED ORDER — AMPHET-DEXTROAMPHET 3-BEAD ER 25 MG PO CP24
25.0000 mg | ORAL_CAPSULE | Freq: Every day | ORAL | 0 refills | Status: DC
  Filled 2023-12-12: qty 30, 30d supply, fill #0

## 2023-12-12 MED ORDER — AMPHET-DEXTROAMPHET 3-BEAD ER 25 MG PO CP24
25.0000 mg | ORAL_CAPSULE | Freq: Every day | ORAL | 0 refills | Status: DC
  Filled 2024-05-04 (×2): qty 30, 30d supply, fill #0

## 2023-12-12 MED ORDER — AMPHET-DEXTROAMPHET 3-BEAD ER 25 MG PO CP24
25.0000 mg | ORAL_CAPSULE | Freq: Every day | ORAL | 0 refills | Status: DC
  Filled 2024-03-24: qty 30, 30d supply, fill #0

## 2023-12-12 NOTE — Progress Notes (Signed)
 Virtual Visit via Video Note  Subjective  CC:  Chief Complaint  Patient presents with   ADHD     I connected with Karen Lozano on 12/12/23 at 11:30 AM EDT by a video enabled telemedicine application and verified that I am speaking with the correct person using two identifiers. Location patient: Home Location provider: [facility] [address] Office Persons participating in the virtual visit: ANASTAZJA ISAAC, Karen Ora, MD Trudie Reed CMA  I discussed the limitations of evaluation and management by telemedicine and the availability of in person appointments. The patient expressed understanding and agreed to proceed. HPI: Karen Lozano is a 53 y.o. female who was contacted today to address the problems listed above in the chief complaint. ADD: doing great on meds. No concerns. Can't work without it! No side effects. Out x 3 days.  I reviewed patient's records from the PMP aware controlled substance registry today.   Assessment  1. Attention deficit hyperactivity disorder (ADHD), combined type      Plan  ADD:  well controlled. Refilled meds.  I discussed the assessment and treatment plan with the patient. The patient was provided an opportunity to ask questions and all were answered. The patient agreed with the plan and demonstrated an understanding of the instructions.   The patient was advised to call back or seek an in-person evaluation if the symptoms worsen or if the condition fails to improve as anticipated. Follow up: 6 mo for cpe Visit date not found  No orders of the defined types were placed in this encounter.     I reviewed the patients updated PMH, FH, and SocHx.    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   D-Mannose 500 MG CAPS Take by mouth.   [DISCONTINUED] Amphet-Dextroamphet 3-Bead ER (MYDAYIS) 25 MG CP24 Take 1 capsule (25 mg total) by mouth daily.   [DISCONTINUED] Amphet-Dextroamphet 3-Bead ER (MYDAYIS) 25 MG CP24 Take 1 capsule (25 mg total) by mouth daily.   [DISCONTINUED] Amphet-Dextroamphet 3-Bead ER (MYDAYIS) 25 MG CP24 Take 1 capsule (25 mg total) by mouth daily.   [DISCONTINUED] Amphet-Dextroamphet 3-Bead ER (MYDAYIS) 25 MG CP24 Take 1 capsule (25 mg total) by mouth daily.   [DISCONTINUED] Amphet-Dextroamphet 3-Bead ER (MYDAYIS) 25 MG CP24 Take 1 capsule (25 mg total) by mouth daily.    Allergies: Patient has no known allergies. Family History: Patient family history includes ADD / ADHD in her daughter; Basal cell carcinoma in her father; Colon cancer in her maternal grandmother; Colon polyps in her maternal grandmother; Diabetes in her maternal grandmother; Endometriosis in her mother; Esophageal cancer in an other family member; Hypertension in her maternal grandmother; Learning disabilities in her daughter; Lymphoma in her mother; Rectal cancer in an other family member; Stomach cancer in an other family member; Stroke in her daughter and father. Social History:  Patient  reports that she has never smoked. She has never used smokeless tobacco. She reports that she does not currently use alcohol. She reports that she does not use drugs.  Review of Systems: Constitutional: Negative for fever malaise or  anorexia Cardiovascular: negative for chest pain Respiratory: negative for SOB or persistent cough Gastrointestinal: negative for abdominal pain  OBJECTIVE Vitals: Ht 5\' 2"  (1.575 m)   Wt 120 lb (54.4 kg)   LMP 10/01/2018   BMI 21.95 kg/m  General: no acute distress , A&Ox3  Karen Ora, MD

## 2024-02-12 ENCOUNTER — Other Ambulatory Visit (HOSPITAL_BASED_OUTPATIENT_CLINIC_OR_DEPARTMENT_OTHER): Payer: Self-pay

## 2024-03-21 IMAGING — MG MM DIGITAL SCREENING BILAT W/ TOMO AND CAD
8 series · 9 of 24 positions shown · non-contrast
Comparison: Previous exam(s).

CLINICAL DATA: Screening.

EXAM:
DIGITAL SCREENING BILATERAL MAMMOGRAM WITH TOMOSYNTHESIS AND CAD
TECHNIQUE: Bilateral screening digital craniocaudal and mediolateral oblique
mammograms were obtained. Bilateral screening digital breast
tomosynthesis was performed. The images were evaluated with
computer-aided detection.

[R MLO synth-2D]
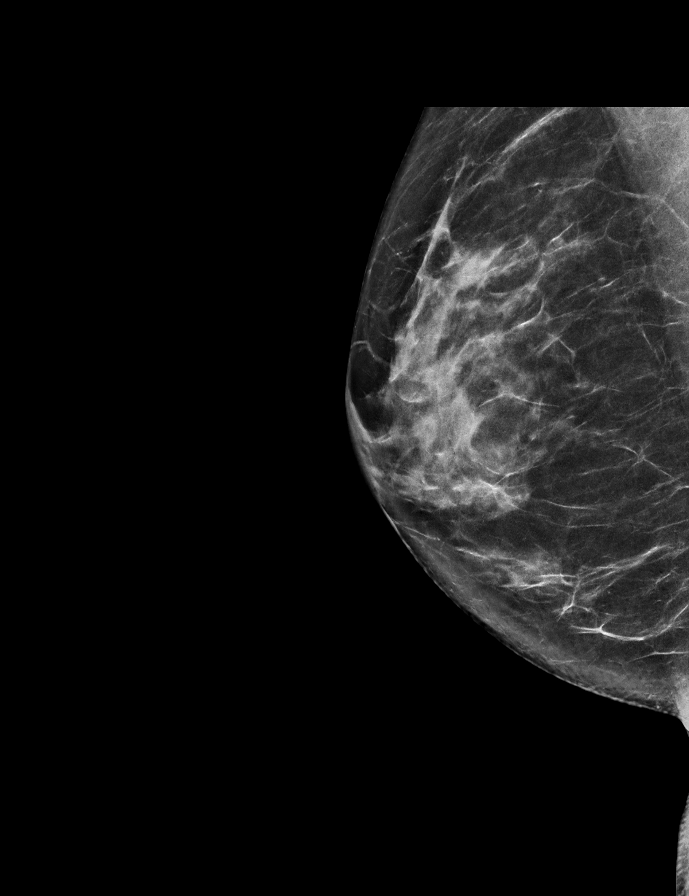

[L CC synth-2D]
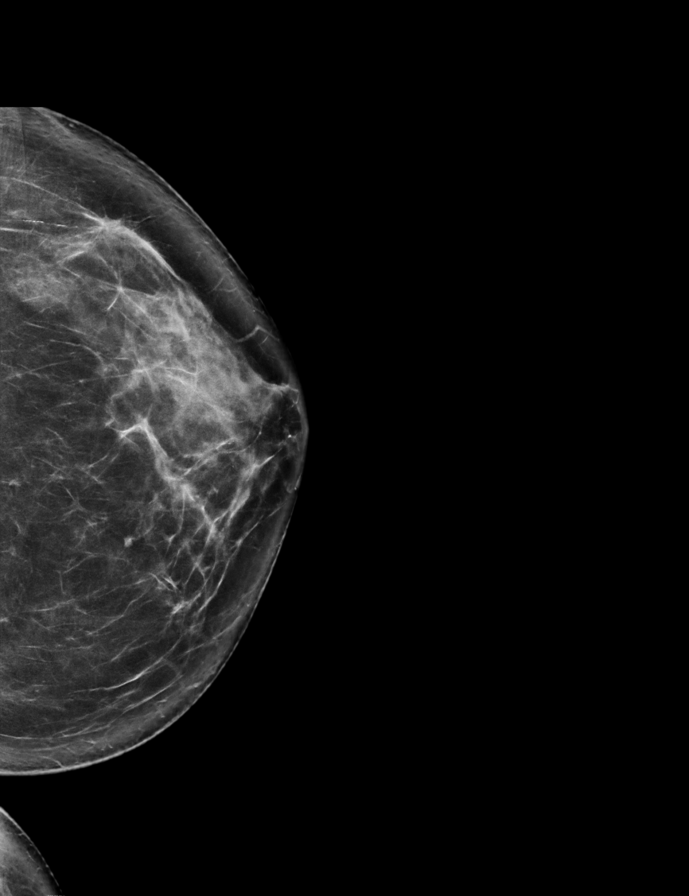

[L MLO synth-2D]
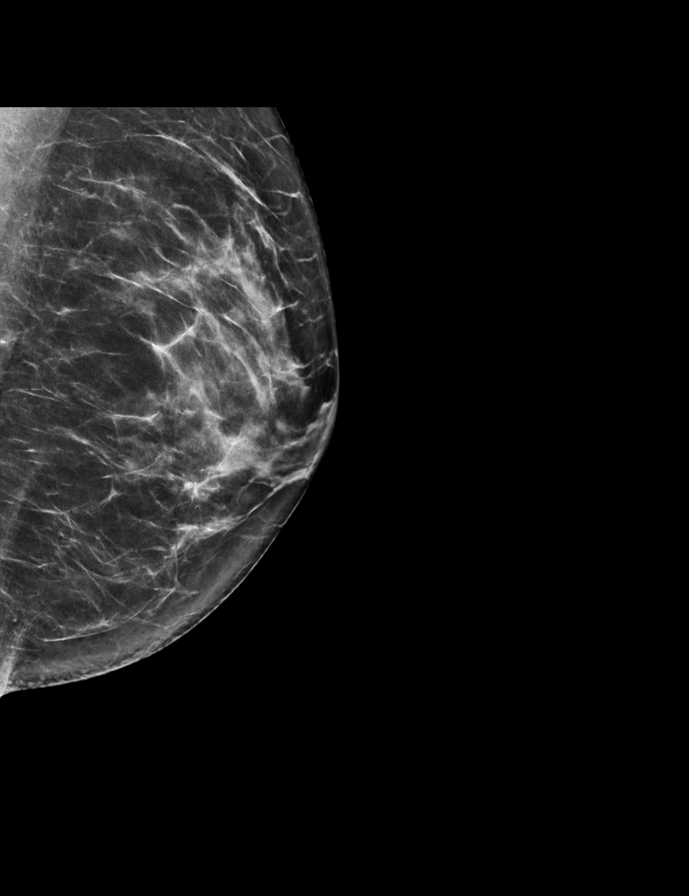

[R CC synth-2D]
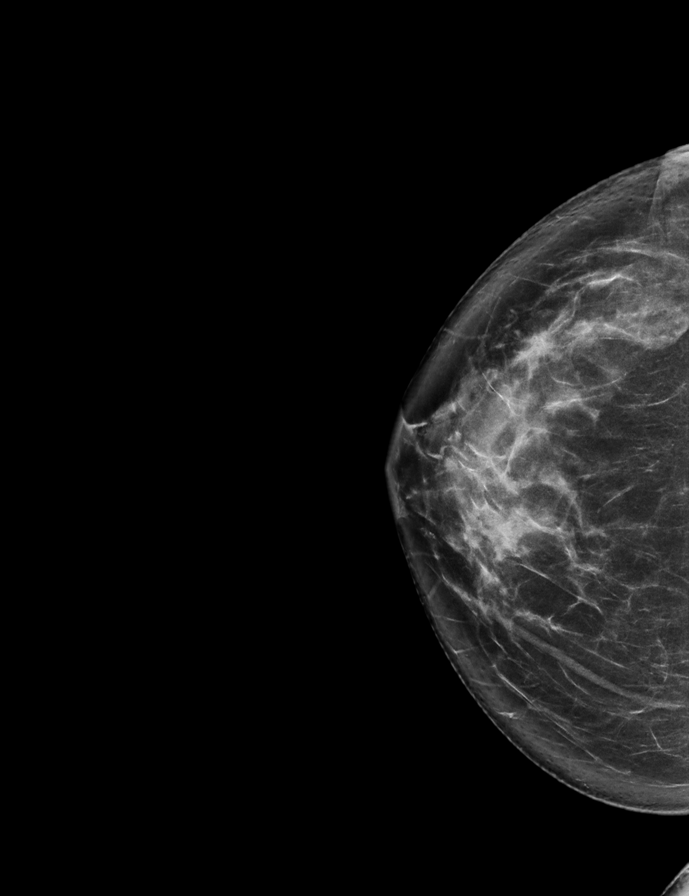

[L CC tomo · 2 of 74 frames shown]
[frame 24/74]
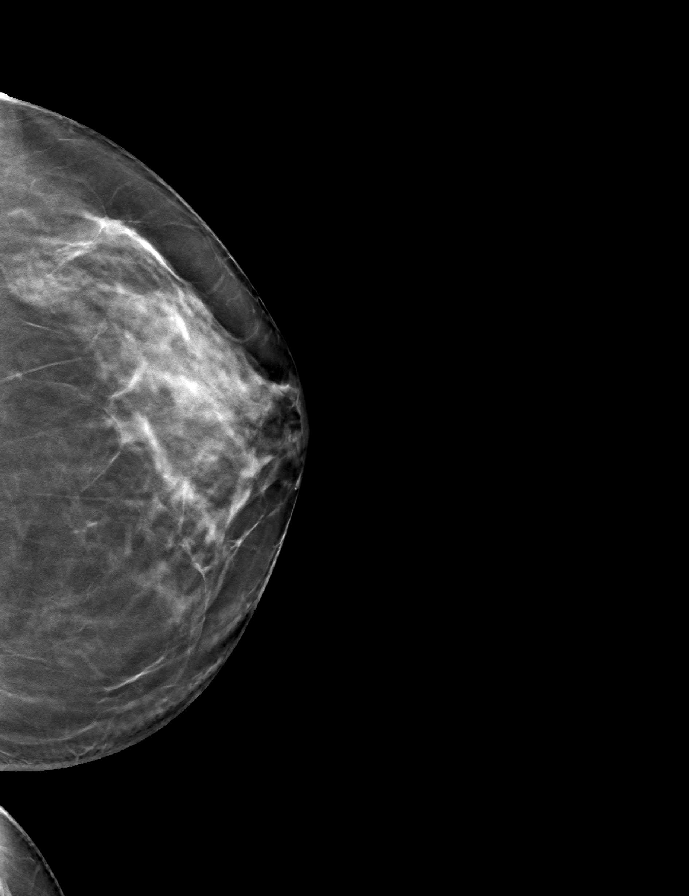
[frame 37/74]
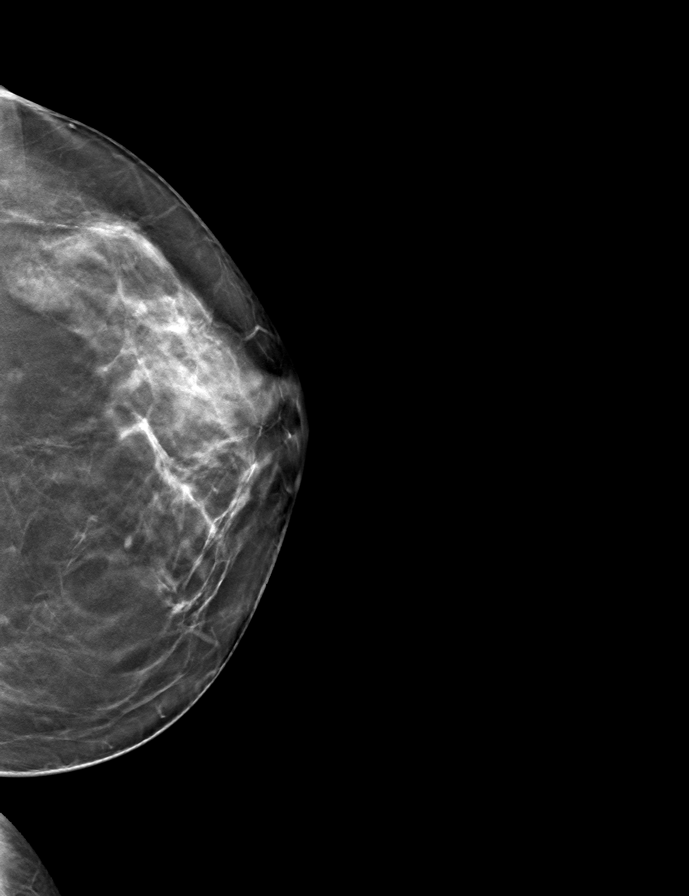

[R MLO tomo · tomo slice 36/71.0]
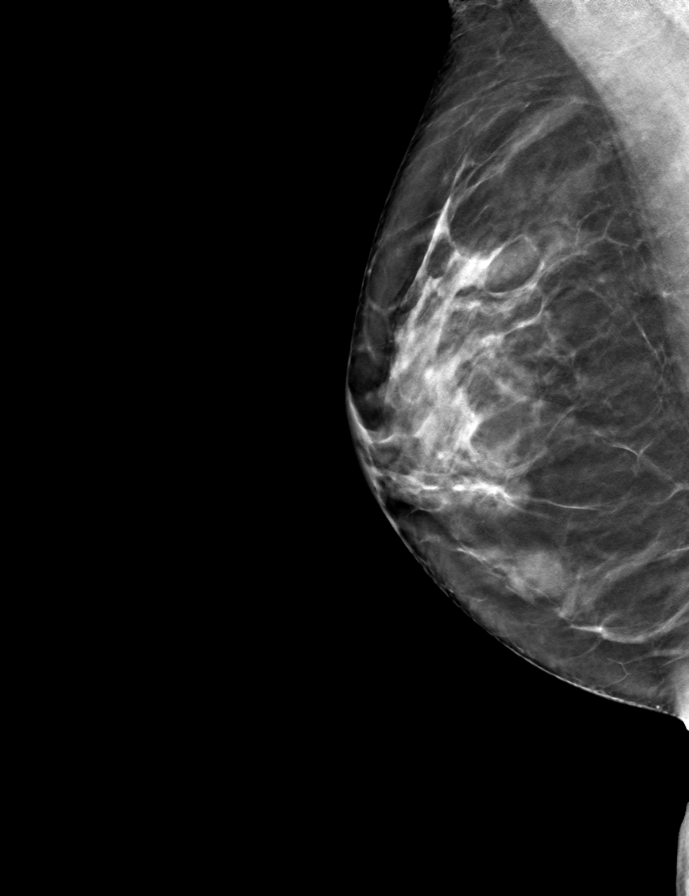

[L MLO tomo · tomo slice 35/70.0]
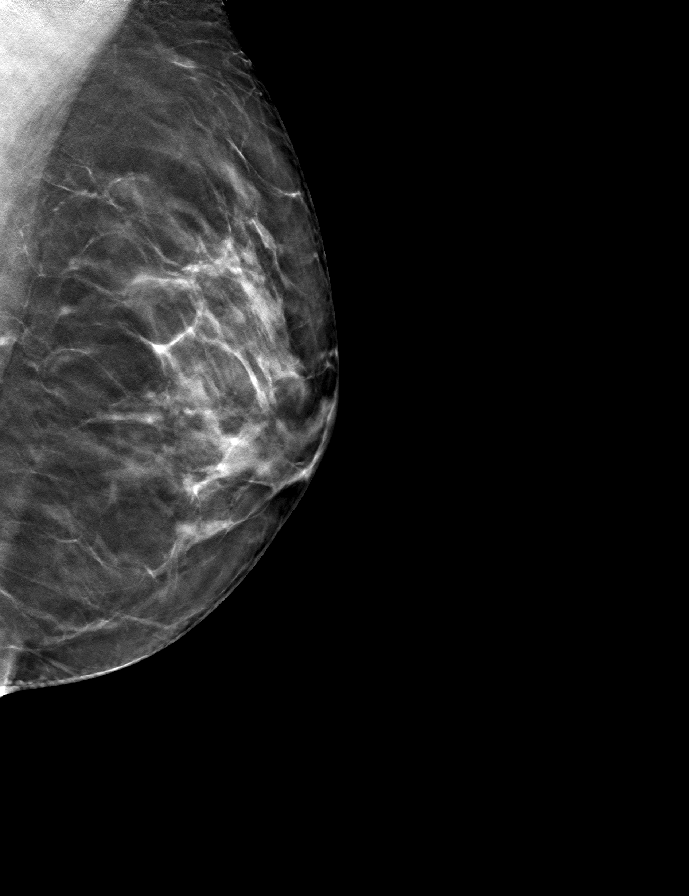

[R CC tomo · tomo slice 39/77.0]
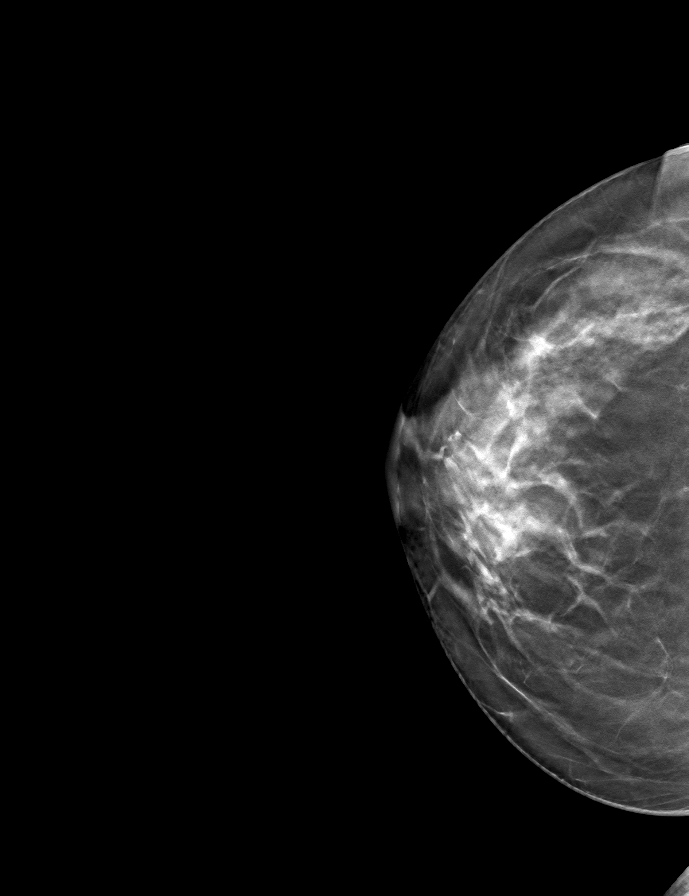

[9 of 24 positions shown; findings below may reference images not displayed]

ACR Breast Density Category c: The breast tissue is heterogeneously
dense, which may obscure small masses.
FINDINGS: There are no findings suspicious for malignancy.
IMPRESSION: No mammographic evidence of malignancy. A result letter of this
screening mammogram will be mailed directly to the patient.

RECOMMENDATION:
Screening mammogram in one year. (Code:Q3-W-BC3)

BI-RADS CATEGORY  1: Negative.

## 2024-03-24 ENCOUNTER — Other Ambulatory Visit (HOSPITAL_BASED_OUTPATIENT_CLINIC_OR_DEPARTMENT_OTHER): Payer: Self-pay

## 2024-05-04 ENCOUNTER — Other Ambulatory Visit (HOSPITAL_BASED_OUTPATIENT_CLINIC_OR_DEPARTMENT_OTHER): Payer: Self-pay

## 2024-06-11 ENCOUNTER — Other Ambulatory Visit (HOSPITAL_BASED_OUTPATIENT_CLINIC_OR_DEPARTMENT_OTHER): Payer: Self-pay

## 2024-06-11 ENCOUNTER — Ambulatory Visit (INDEPENDENT_AMBULATORY_CARE_PROVIDER_SITE_OTHER): Payer: Commercial Managed Care - PPO | Admitting: Family Medicine

## 2024-06-11 ENCOUNTER — Encounter: Payer: Self-pay | Admitting: Family Medicine

## 2024-06-11 VITALS — BP 114/80 | HR 91 | Temp 97.7°F | Ht 62.0 in | Wt 115.6 lb

## 2024-06-11 DIAGNOSIS — R760 Raised antibody titer: Secondary | ICD-10-CM | POA: Diagnosis not present

## 2024-06-11 DIAGNOSIS — R87610 Atypical squamous cells of undetermined significance on cytologic smear of cervix (ASC-US): Secondary | ICD-10-CM | POA: Diagnosis not present

## 2024-06-11 DIAGNOSIS — F902 Attention-deficit hyperactivity disorder, combined type: Secondary | ICD-10-CM | POA: Diagnosis not present

## 2024-06-11 DIAGNOSIS — Z0001 Encounter for general adult medical examination with abnormal findings: Secondary | ICD-10-CM

## 2024-06-11 LAB — COMPREHENSIVE METABOLIC PANEL WITH GFR
ALT: 10 U/L (ref 0–35)
AST: 18 U/L (ref 0–37)
Albumin: 4.4 g/dL (ref 3.5–5.2)
Alkaline Phosphatase: 49 U/L (ref 39–117)
BUN: 16 mg/dL (ref 6–23)
CO2: 30 meq/L (ref 19–32)
Calcium: 10.1 mg/dL (ref 8.4–10.5)
Chloride: 101 meq/L (ref 96–112)
Creatinine, Ser: 0.83 mg/dL (ref 0.40–1.20)
GFR: 80.13 mL/min (ref >60.00)
Glucose, Bld: 87 mg/dL (ref 70–99)
Potassium: 5.1 meq/L (ref 3.5–5.1)
Sodium: 139 meq/L (ref 135–145)
Total Bilirubin: 0.5 mg/dL (ref 0.2–1.2)
Total Protein: 6.8 g/dL (ref 6.0–8.3)

## 2024-06-11 LAB — CBC WITH DIFFERENTIAL/PLATELET
Basophils Absolute: 0.1 K/uL (ref 0.0–0.1)
Basophils Relative: 1.1 % (ref 0.0–3.0)
Eosinophils Absolute: 0.1 K/uL (ref 0.0–0.7)
Eosinophils Relative: 2.1 % (ref 0.0–5.0)
HCT: 38.4 % (ref 36.0–46.0)
Hemoglobin: 12.6 g/dL (ref 12.0–15.0)
Lymphocytes Relative: 26.4 % (ref 12.0–46.0)
Lymphs Abs: 1.3 K/uL (ref 0.7–4.0)
MCHC: 32.9 g/dL (ref 30.0–36.0)
MCV: 82 fl (ref 78.0–100.0)
Monocytes Absolute: 0.4 K/uL (ref 0.1–1.0)
Monocytes Relative: 8.1 % (ref 3.0–12.0)
Neutro Abs: 3.1 K/uL (ref 1.4–7.7)
Neutrophils Relative %: 62.3 % (ref 43.0–77.0)
Platelets: 277 K/uL (ref 150.0–400.0)
RBC: 4.68 Mil/uL (ref 3.87–5.11)
RDW: 14.2 % (ref 11.5–15.5)
WBC: 4.9 K/uL (ref 4.0–10.5)

## 2024-06-11 LAB — LIPID PANEL
Cholesterol: 213 mg/dL — ABNORMAL HIGH (ref 0–200)
HDL: 58.3 mg/dL (ref >39.00)
LDL Cholesterol: 131 mg/dL — ABNORMAL HIGH (ref 0–99)
NonHDL: 154.73
Total CHOL/HDL Ratio: 4
Triglycerides: 119 mg/dL (ref 0.0–149.0)
VLDL: 23.8 mg/dL (ref 0.0–40.0)

## 2024-06-11 LAB — TSH: TSH: 1.42 u[IU]/mL (ref 0.35–5.50)

## 2024-06-11 LAB — VITAMIN D 25 HYDROXY (VIT D DEFICIENCY, FRACTURES): VITD: 27.57 ng/mL — ABNORMAL LOW (ref 30.00–100.00)

## 2024-06-11 MED ORDER — AMPHET-DEXTROAMPHET 3-BEAD ER 25 MG PO CP24
25.0000 mg | ORAL_CAPSULE | Freq: Every day | ORAL | 0 refills | Status: AC
  Filled 2024-09-28 (×2): qty 30, 30d supply, fill #0

## 2024-06-11 MED ORDER — AMPHET-DEXTROAMPHET 3-BEAD ER 25 MG PO CP24
25.0000 mg | ORAL_CAPSULE | Freq: Every day | ORAL | 0 refills | Status: AC
  Filled 2024-08-18: qty 30, 30d supply, fill #0

## 2024-06-11 MED ORDER — AMPHET-DEXTROAMPHET 3-BEAD ER 25 MG PO CP24
25.0000 mg | ORAL_CAPSULE | Freq: Every day | ORAL | 0 refills | Status: AC
  Filled 2024-06-11 – 2024-06-22 (×2): qty 30, 30d supply, fill #0

## 2024-06-11 NOTE — Progress Notes (Signed)
 Subjective  Chief Complaint  Patient presents with   Annual Exam    Pt here for Annual Exam and is not currently fasting. Pt will schedule mamm    HPI: Karen Lozano is a 53 y.o. female who presents to [facility] [place] today for a Female Wellness Visit. She also has the concerns and/or needs as listed above in the chief complaint. These will be addressed in addition to the Health Maintenance Visit.   Wellness Visit: annual visit with health maintenance review and exam  HM: sees Dr. Cleotilde; pap due next year for ascus, neg HR HPV, last done 2023. CRC screen current. Due mammo now. Healthy lifestyle. Will start back at gym soon. Works from home.  Believes she had her Tdap about 5 or 6 years ago and will check with [place] at work.  She will be getting her flu shot today.  Eligible for Prevnar 20. Chronic disease f/u and/or acute problem visit: (deemed necessary to be done in addition to the wellness visit): ADD: Continues to do very well on Mydayis.  Lasts most of her workday.  No side effects.  Reviewed [place]  controlled substance database.  Use is appropriate.  Due for refill. Antiphospholipid antibody positive.  Assessment  1. Encounter for well adult exam with abnormal findings   2. Attention deficit hyperactivity disorder (ADHD), combined type   3. Antiphospholipid antibody positive   4. ASCUS of cervix with negative high risk HPV      Plan  Female Wellness Visit: Age appropriate Health Maintenance and Prevention measures were discussed with patient. Included topics are cancer screening recommendations, ways to keep healthy (see AVS) including dietary and exercise recommendations, regular eye and dental care, use of seat belts, and avoidance of moderate alcohol use and tobacco use.  BMI: discussed patient's BMI and encouraged positive lifestyle modifications to help get to or maintain a target BMI. HM needs and immunizations were addressed and ordered.  See below for orders. See HM and immunization section for updates. Routine labs and screening tests ordered including cmp, cbc and lipids where appropriate. Discussed recommendations regarding Vit D and calcium supplementation (see AVS)  Chronic disease management visit and/or acute problem visit: ADD is well-controlled.  Refill 3 months Follow up: 6 months for ADD recheck Orders Placed This Encounter  Procedures   VITAMIN D  25 Hydroxy (Vit-D Deficiency, Fractures)   CBC with Differential/Platelet   Comprehensive metabolic panel with GFR   Lipid panel   TSH   Meds ordered this encounter  Medications   Amphet-Dextroamphet 3-Bead ER (MYDAYIS) 25 MG CP24    Sig: Take 1 capsule (25 mg total) by mouth daily.    Dispense:  30 capsule    Refill:  0   Amphet-Dextroamphet 3-Bead ER (MYDAYIS) 25 MG CP24    Sig: Take 1 capsule (25 mg total) by mouth daily.    Dispense:  30 capsule    Refill:  0   Amphet-Dextroamphet 3-Bead ER (MYDAYIS) 25 MG CP24    Sig: Take 1 capsule (25 mg total) by mouth daily.    Dispense:  30 capsule    Refill:  0      Body mass index is 21.14 kg/m. 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 Adderall, ritalin . Trial of stratterra initiated 09/2018; trial of mydais 09/2022    Seasonal affective disorder 10/07/2018    Tried lexapro and one other SSRI: depressed libido and couldn't sleep    Migraine headache 01/19/2016   Antiphospholipid antibody positive 03/16/2015   Health Maintenance  Topic Date Due   DTaP/Tdap/Td (1 - Tdap) Never done    Hepatitis B Vaccines 19-59 Average Risk (1 of 3 - 19+ 3-dose series) Never done   Pneumococcal Vaccine: 50+ Years (1 of 1 - PCV) Never done   Mammogram  06/10/2024   COVID-19 Vaccine (1 - 2024-25 season) 06/27/2024 (Originally 05/10/2024)   Influenza Vaccine  12/07/2024 (Originally 04/09/2024)   Cervical Cancer Screening (HPV/Pap Cotest)  04/05/2027   Colonoscopy  03/16/2031   Hepatitis C Screening  Completed   HIV Screening  Completed   Zoster Vaccines- Shingrix   Completed   HPV VACCINES  Aged Out   Meningococcal B Vaccine  Aged Out   Immunization History  Administered Date(s) Administered   Influenza,inj,Quad PF,6+ Mos 06/23/2022   Influenza-Unspecified 07/19/2020   Zoster Recombinant(Shingrix ) 06/10/2023, 08/12/2023   We updated and reviewed the patient's past history in detail and it is documented below. Allergies: Patient has no known allergies. Past Medical History Patient  has a past medical history of Anemia, Anticardiolipin antibody positive, Anxiety, History of anemia, Migraine, and Seasonal affective disorder (10/07/2018). Past Surgical History Patient  has a past surgical history that includes Cesarean section; Tonsillectomy; and Wisdom tooth extraction. Family History: Patient family history includes ADD / ADHD in her daughter; Basal cell carcinoma in her father; Colon cancer in her maternal grandmother; Colon polyps in her maternal grandmother; Diabetes in her maternal grandmother; Endometriosis in her mother; Esophageal cancer in an other family member; Hypertension in her maternal grandmother; Learning disabilities in her daughter; Lymphoma in her mother; Rectal cancer in an other family member; Stomach cancer in an other family member; Stroke in her daughter and father. Social History:  Patient  reports that she has never smoked. She has never used smokeless tobacco. She reports that she does not currently use alcohol. She reports that she does not use drugs.  Review of  Systems: Constitutional: negative for fever or malaise Ophthalmic: negative for photophobia, double vision or loss of vision Cardiovascular: negative for chest pain, dyspnea on exertion, or new LE swelling Respiratory: negative for SOB or persistent cough Gastrointestinal: negative for abdominal pain, change in bowel habits or melena Genitourinary: negative for dysuria or gross hematuria, no abnormal uterine bleeding or disharge Musculoskeletal: negative for new gait disturbance or muscular weakness Integumentary: negative for new or persistent rashes, no breast lumps Neurological: negative for TIA or stroke symptoms Psychiatric: negative for SI or delusions Allergic/Immunologic: negative for hives  Patient Care Team    Relationship Specialty Notifications Start End  Karen Lavern CROME, MD PCP - General Family Medicine  10/07/18   Karen Ronal RAMAN, MD Consulting Physician Gynecology  10/07/18     Objective  Vitals: BP 114/80   Pulse 91   Temp 97.7 F (36.5 C)   Ht 5' 2 (1.575 m)   Wt 115 lb 9.6 oz (52.4 kg)   LMP 10/01/2018   SpO2 96%   BMI 21.14 kg/m  General:  Well developed, well nourished, no acute distress  Psych:  Alert and orientedx3,normal mood and affect HEENT:  Normocephalic, atraumatic, non-icteric sclera,  supple neck without adenopathy, mass or thyromegaly Cardiovascular:  Normal S1, S2, RRR without gallop, rub or murmur Respiratory:  Good breath sounds bilaterally, CTAB with normal respiratory effort Gastrointestinal: normal bowel sounds, soft, non-tender, no noted masses. No HSM MSK: extremities without edema, joints without erythema or swelling Neurologic:    Mental status is normal.  Gross motor and sensory exams are normal.  No tremor  Commons side effects, risks, benefits, and alternatives for medications and treatment plan prescribed today were discussed, and the patient expressed understanding of the given instructions. Patient is instructed to call or message  via MyChart if he/she has any questions or concerns regarding our treatment plan. No barriers to understanding were identified. We discussed Red Flag symptoms and signs in detail. Patient expressed understanding regarding what to do in case of urgent or emergency type symptoms.  Medication list was reconciled, printed and provided to the patient in AVS. Patient instructions and summary information was reviewed with the patient as documented in the AVS. This note was prepared with assistance of Dragon voice recognition software. Occasional wrong-word or sound-a-like substitutions may have occurred due to the inherent limitations of voice recognition software

## 2024-06-11 NOTE — Patient Instructions (Addendum)
 Please return in 6 months to recheck ADD   I will release your lab results to you on your MyChart account with further instructions. You may see the results before I do, but when I review them I will send you a message with my report or have my assistant call you if things need to be discussed. Please reply to my message with any questions. Thank you!   If you have any questions or concerns, please don't hesitate to send me a message via MyChart or call the office at [phone]. Thank you for visiting with us  today! It's our pleasure caring for you.    VISIT SUMMARY: Today, you came in for a medication check and routine vaccinations. We discussed your current health concerns, including ADD, menopausal symptoms, bone density, preventive health, and hyperlipidemia. We also talked about the stress you're experiencing at home.  YOUR PLAN: -ATTENTION-DEFICIT HYPERACTIVITY DISORDER (ADHD): Your ADHD is well-managed with your current medication, although you are experiencing some dry mouth later in the day. We will continue with your current medication and monitor for any side effects.  -GENERAL HEALTH MAINTENANCE: We discussed scheduling a mammogram, checking the status of your tetanus shot, getting a flu vaccine at the pharmacy, and administering the Prevnar 20 vaccine after your flu shot. We also talked about scheduling a Pap smear for next year.  INSTRUCTIONS: Please schedule a mammogram and verify the status of your tetanus shot. Get your flu vaccine at the pharmacy and then schedule the Prevnar 20 vaccine. We will also need to schedule a Pap smear for next year. Additionally, we will order a fasting lipid panel to check your cholesterol levels.                      Contains text generated by Abridge.                                 Contains text generated by Abridge.

## 2024-06-13 ENCOUNTER — Ambulatory Visit: Payer: Self-pay | Admitting: Family Medicine

## 2024-06-13 NOTE — Progress Notes (Signed)
 See mychart note Dear Karen Lozano, Your labs look good but your vitamin D  level is low again so restart your otc supplements.  Take care! Sincerely, Dr. Jodie  The 10-year ASCVD risk score (Arnett DK, et al., 2019) is: 1.5%   Values used to calculate the score:     Age: 53 years     Clincally relevant sex: Female     Is Non-Hispanic African American: No     Diabetic: No     Tobacco smoker: No     Systolic Blood Pressure: 114 mmHg     Is BP treated: No     HDL Cholesterol: 58.3 mg/dL     Total Cholesterol: 213 mg/dL

## 2024-06-21 ENCOUNTER — Other Ambulatory Visit (HOSPITAL_BASED_OUTPATIENT_CLINIC_OR_DEPARTMENT_OTHER): Payer: Self-pay

## 2024-06-22 ENCOUNTER — Other Ambulatory Visit: Payer: Self-pay

## 2024-06-22 ENCOUNTER — Other Ambulatory Visit (HOSPITAL_BASED_OUTPATIENT_CLINIC_OR_DEPARTMENT_OTHER): Payer: Self-pay

## 2024-06-22 DIAGNOSIS — R300 Dysuria: Secondary | ICD-10-CM | POA: Diagnosis not present

## 2024-06-22 MED ORDER — FLUZONE 0.5 ML IM SUSY
0.5000 mL | PREFILLED_SYRINGE | Freq: Once | INTRAMUSCULAR | 0 refills | Status: AC
  Filled 2024-06-22: qty 0.5, 1d supply, fill #0

## 2024-06-22 NOTE — ED Triage Notes (Signed)
 Pt POV reporting UTI sx, began last week, resolved with OTC meds and then flank pain and hematuria returned tonight.

## 2024-06-23 ENCOUNTER — Emergency Department (HOSPITAL_BASED_OUTPATIENT_CLINIC_OR_DEPARTMENT_OTHER)
Admission: EM | Admit: 2024-06-23 | Discharge: 2024-06-23 | Disposition: A | Attending: Emergency Medicine | Admitting: Emergency Medicine

## 2024-06-23 DIAGNOSIS — R300 Dysuria: Secondary | ICD-10-CM

## 2024-06-23 LAB — URINALYSIS, ROUTINE W REFLEX MICROSCOPIC

## 2024-06-23 LAB — URINALYSIS, MICROSCOPIC (REFLEX)
RBC / HPF: >50 RBC/hpf (ref 0–5)
WBC, UA: >50 WBC/hpf (ref 0–5)

## 2024-06-23 MED ORDER — CEPHALEXIN 500 MG PO CAPS: 500.0000 mg | ORAL_CAPSULE | Freq: Three times a day (TID) | ORAL | 0 refills | Status: AC

## 2024-06-23 MED ORDER — CEFTRIAXONE SODIUM 1 G IJ SOLR
1.0000 g | Freq: Once | INTRAMUSCULAR | Status: AC
  Administered 2024-06-23: 1 g via INTRAMUSCULAR
  Filled 2024-06-23: qty 10

## 2024-06-23 MED ORDER — CEPHALEXIN 500 MG PO CAPS: 500.0000 mg | ORAL_CAPSULE | Freq: Three times a day (TID) | ORAL | 0 refills | Status: DC

## 2024-06-23 NOTE — ED Provider Notes (Addendum)
 [place] EMERGENCY DEPARTMENT AT [facility] Provider Note   CSN: [id number] Arrival date & time: 06/22/24  2311     Patient presents with: Dysuria   Karen Lozano is a 53 y.o. female.   HPI     This is a 53 year old female who presents with urinary symptoms.  Reports history of frequent UTIs.  She states that she began to have symptoms last week and took over-the-counter medications and drink plenty of fluids.  She states that her symptoms went away.  However tonight around 10 PM she had onset of dysuria, hematuria, worsening urinary symptoms.  Denies fevers.  Reports some midline lower back pain but no lateralizing back pain.  No nausea or vomiting.  Prior to Admission medications   Medication Sig Start Date End Date Taking? Authorizing Provider  Amphet-Dextroamphet 3-Bead ER (MYDAYIS) 25 MG CP24 Take 1 capsule (25 mg total) by mouth daily. 06/11/24   Jodie Lavern CROME, MD  Amphet-Dextroamphet 3-Bead ER (MYDAYIS) 25 MG CP24 Take 1 capsule (25 mg total) by mouth daily. 07/11/24   Jodie Lavern CROME, MD  Amphet-Dextroamphet 3-Bead ER (MYDAYIS) 25 MG CP24 Take 1 capsule (25 mg total) by mouth daily. 08/10/24   Jodie Lavern CROME, MD  cephALEXin  (KEFLEX ) 500 MG capsule Take 1 capsule (500 mg total) by mouth 3 (three) times daily. 06/23/24   Shabria Egley, Charmaine FALCON, MD  D-Mannose 500 MG CAPS Take by mouth.    [provider]  influenza vac split trivalent PF (FLUZONE) 0.5 ML injection Inject 0.5 mLs into the muscle once for 1 dose. 06/22/24 06/23/24  Luiz Channel, MD  atomoxetine  (STRATTERA ) 40 MG capsule Take 1 capsule (40 mg total) by mouth daily. 09/13/20 11/14/20  Jodie Lavern CROME, MD    Allergies: Patient has no known allergies.    Review of Systems  Constitutional:  Negative for fever.  Respiratory:  Negative for shortness of breath.   Cardiovascular:  Negative for chest pain.  Gastrointestinal:  Negative for abdominal pain.  Genitourinary:  Positive for dysuria and  hematuria.  All other systems reviewed and are negative.   Updated Vital Signs BP 106/68   Pulse 70   Temp 98.2 F (36.8 C) (Oral)   Resp 16   Ht 1.549 m (5' 1)   LMP 10/01/2018   SpO2 100%   BMI 21.84 kg/m   Physical Exam Vitals and nursing note reviewed.  Constitutional:      Appearance: She is well-developed. She is not ill-appearing.  HENT:     Head: Normocephalic and atraumatic.  Eyes:     Pupils: Pupils are equal, round, and reactive to light.  Cardiovascular:     Rate and Rhythm: Normal rate and regular rhythm.  Pulmonary:     Effort: Pulmonary effort is normal. No respiratory distress.  Abdominal:     Palpations: Abdomen is soft.     Tenderness: There is no abdominal tenderness. There is no right CVA tenderness or left CVA tenderness.  Musculoskeletal:     Cervical back: Neck supple.  Skin:    General: Skin is warm and dry.  Neurological:     Mental Status: She is alert and oriented to person, place, and time.  Psychiatric:        Mood and Affect: Mood normal.     (all labs ordered are listed, but only abnormal results are displayed) Labs Reviewed  URINALYSIS, ROUTINE W REFLEX MICROSCOPIC - Abnormal; Notable for the following components:      Result Value  Color, Urine AMBER (*)    APPearance CLOUDY (*)    Glucose, UA   (*)    Value: TEST NOT REPORTED DUE TO COLOR INTERFERENCE OF URINE PIGMENT   Hgb urine dipstick   (*)    Value: TEST NOT REPORTED DUE TO COLOR INTERFERENCE OF URINE PIGMENT   Bilirubin Urine   (*)    Value: TEST NOT REPORTED DUE TO COLOR INTERFERENCE OF URINE PIGMENT   Ketones, ur   (*)    Value: TEST NOT REPORTED DUE TO COLOR INTERFERENCE OF URINE PIGMENT   Protein, ur   (*)    Value: TEST NOT REPORTED DUE TO COLOR INTERFERENCE OF URINE PIGMENT   Nitrite   (*)    Value: TEST NOT REPORTED DUE TO COLOR INTERFERENCE OF URINE PIGMENT   Leukocytes,Ua   (*)    Value: TEST NOT REPORTED DUE TO COLOR INTERFERENCE OF URINE PIGMENT   All  other components within normal limits  URINALYSIS, MICROSCOPIC (REFLEX) - Abnormal; Notable for the following components:   Bacteria, UA RARE (*)    Non Squamous Epithelial PRESENT (*)    All other components within normal limits  URINE CULTURE    EKG: None  Radiology: No results found.   Procedures   Medications Ordered in the ED  cefTRIAXone (ROCEPHIN) injection 1 g (1 g Intramuscular Given 06/23/24 0249)    Clinical Course as of 06/23/24 0341  Wed Jun 23, 2024  0340 Discharge, patient appeared to have a vagal event.  This was within 10 minutes of receiving Rocephin.  She states she felt dizzy.  Initial blood pressure was in the 80s.  She was placed in Trendelenburg and had improvement of her pressure with fluids.  She gradually improved.  No shortness of breath.  No rash.  No evidence of an allergic reaction.  She ambulated around the department until official discharge. [CH]    Clinical Course User Index [CH] [place]ilene Mcphee, Charmaine FALCON, MD                                 Medical Decision Making Amount and/or Complexity of Data Reviewed Labs: ordered.  Risk Prescription drug management.   This patient presents to the ED for concern of urinary symptoms, this involves an extensive number of treatment options, and is a complaint that carries with it a high risk of complications and morbidity.  I considered the following differential and admission for this acute, potentially life threatening condition.  The differential diagnosis includes UTI, interstitial cystitis, pyelonephritis  MDM:    This is a 53 year old female who presents with urinary symptoms.  She is nontoxic and vital signs are reassuring.  Reports her symptoms are classic for her UTIs.  Denies systemic symptoms such as fevers.  No CVA tenderness on exam.  Lower suspicion for pyelonephritis.  Urinalysis is difficult to interpret because of hematuria.  However, greater than 50 whites and greater than 50 reds with 0-5  squames.  Culture is pending.  Will treat as UTI given classic symptoms.  Patient given a dose of IM Rocephin.  Will discharge with Keflex .  She was given return precautions including fevers, lateralizing back pain.  (Labs, imaging, consults)  Labs: I Ordered, and personally interpreted labs.  The pertinent results include: Urinalysis, urine culture  Imaging Studies ordered: I ordered imaging studies including none I independently visualized and interpreted imaging. I agree with the radiologist interpretation  Additional  history obtained from chart review.  External records from outside source obtained and reviewed including prior evaluations  Cardiac Monitoring: The patient was not maintained on a cardiac monitor.  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    Dispense:  21 capsule    Refill:  0   cephALEXin  (KEFLEX ) 500 MG capsule    Sig: Take 1 capsule (500 mg total) by mouth 3 (three) times daily.    Dispense:  21 capsule     Refill:  0    I have reviewed the patients home medicines and have made adjustments as needed  Problem List / ED Course: Problem List Items Addressed This Visit   None Visit Diagnoses       Dysuria    -  Primary                Final diagnoses:  Dysuria    ED Discharge Orders          Ordered    cephALEXin  (KEFLEX ) 500 MG capsule  3 times daily,   Status:  Discontinued        06/23/24 0238    cephALEXin  (KEFLEX ) 500 MG capsule  3 times daily        06/23/24 0255               Ceci Taliaferro, Charmaine FALCON, MD 06/23/24 [id number]    Bari Charmaine FALCON, MD 06/23/24 [phone]

## 2024-06-23 NOTE — Discharge Instructions (Signed)
 You were seen today with symptoms consistent for the UTI.  Your urine is difficult to interpret but you will be treated for UTI with Keflex .  If you develop back pain, fevers, nausea, vomiting, you should be reevaluated by your primary doctor.

## 2024-06-23 NOTE — ED Notes (Signed)
 Upon discharge patient was walking down the hall, became dizzy and sat down in a near by chair. Patient then briefly became unresponsive. Patient was wheeled back into the room with this RN and charge RN. Dr. Bari called to bedside. Patient quickly became responsive again and was A&O x4.

## 2024-06-24 LAB — URINE CULTURE: Culture: NO GROWTH

## 2024-07-07 ENCOUNTER — Telehealth: Payer: Self-pay | Admitting: Family Medicine

## 2024-07-07 NOTE — Telephone Encounter (Signed)
 LVM TO RS 06/13/25 AW

## 2024-07-26 DIAGNOSIS — N3001 Acute cystitis with hematuria: Secondary | ICD-10-CM | POA: Diagnosis not present

## 2024-08-18 ENCOUNTER — Other Ambulatory Visit: Payer: Self-pay | Admitting: Family Medicine

## 2024-08-18 ENCOUNTER — Other Ambulatory Visit (HOSPITAL_BASED_OUTPATIENT_CLINIC_OR_DEPARTMENT_OTHER): Payer: Self-pay

## 2024-08-18 DIAGNOSIS — Z1231 Encounter for screening mammogram for malignant neoplasm of breast: Secondary | ICD-10-CM

## 2024-08-19 ENCOUNTER — Other Ambulatory Visit (HOSPITAL_BASED_OUTPATIENT_CLINIC_OR_DEPARTMENT_OTHER): Payer: Self-pay

## 2024-08-19 ENCOUNTER — Other Ambulatory Visit: Payer: Self-pay

## 2024-08-25 ENCOUNTER — Ambulatory Visit: Admitting: Family Medicine

## 2024-08-25 ENCOUNTER — Encounter: Payer: Self-pay | Admitting: Family Medicine

## 2024-08-25 ENCOUNTER — Other Ambulatory Visit (HOSPITAL_BASED_OUTPATIENT_CLINIC_OR_DEPARTMENT_OTHER): Payer: Self-pay

## 2024-08-25 ENCOUNTER — Ambulatory Visit: Payer: Self-pay

## 2024-08-25 VITALS — BP 110/80 | HR 85 | Temp 98.3°F | Ht 61.0 in | Wt 118.6 lb

## 2024-08-25 DIAGNOSIS — R319 Hematuria, unspecified: Secondary | ICD-10-CM

## 2024-08-25 DIAGNOSIS — R300 Dysuria: Secondary | ICD-10-CM

## 2024-08-25 DIAGNOSIS — R3989 Other symptoms and signs involving the genitourinary system: Secondary | ICD-10-CM

## 2024-08-25 LAB — POCT URINALYSIS DIPSTICK
Bilirubin, UA: NEGATIVE
Blood, UA: POSITIVE — AB
Glucose, UA: NEGATIVE
Ketones, UA: NEGATIVE
Nitrite, UA: POSITIVE
Protein, UA: NEGATIVE
Spec Grav, UA: <=1.005 — AB (ref 1.010–1.025)
Urobilinogen, UA: 1 U/dL
pH, UA: 5.5 (ref 5.0–8.0)

## 2024-08-25 MED ORDER — NITROFURANTOIN MONOHYD MACRO 100 MG PO CAPS
100.0000 mg | ORAL_CAPSULE | Freq: Two times a day (BID) | ORAL | 0 refills | Status: AC
  Filled 2024-08-25: qty 14, 7d supply, fill #0

## 2024-08-25 NOTE — Telephone Encounter (Signed)
 Attempted contact x 1. Left VM. Will attempt to call patient at a later time.   Copied from CRM #[id number]. Topic: Clinical - Red Word Triage >> Aug 25, 2024  8:34 AM Mia F wrote: Red Word that prompted transfer to Nurse Triage: Possible UTI. She was scheduled to be seen but want to cancel the appt and just drop off a urine sample. Pt says she is having a lot of pain, blood in her urine, frequency and urgency. She says it just suddenly came on. Started today.

## 2024-08-25 NOTE — Telephone Encounter (Signed)
 Called [facility] and advised Havlyn about patient's situation

## 2024-08-25 NOTE — Progress Notes (Signed)
 Established Patient Office Visit   Subjective  Patient ID: Karen Lozano, female    DOB: April 23, 1971  Age: 53 y.o. MRN: [id number]  Chief Complaint  Patient presents with   Acute Visit    Patient came in for blood in urine, burning during urination, urgency, frequency that started this morning.  Patient gets UTIs often and they have started occuring more frequently.  Patient took pyridium and cephalexin  this morning.    Pt is a 53 yo female followed by Lavern Heck, MD who was seen for acute urinary concerns.  Patient endorses hematuria, urgency, frequency, dysuria x 1 day.  Symptoms started today.  Patient took leftover cefdinir and Pyridium prior to appointment.  Endorses 2 visits in the last 2 months for UTI symptoms.  Seen in ED on 10/15 and given Keflex .  Patient states she could drink more water during the day.  Denies fever, chills, nausea, vomiting, constipation, back pain, abdominal pain.  Inquires if menopausal symptoms contributing to her frequent UTIs.     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   Anxiety    History of anemia    during pregnancy   Migraine    had previous MRI/MRA-diag with ocular migraines   Seasonal affective disorder 10/07/2018   Tried lexapro and one other SSRI: depressed libido and couldn't sleep   Past Surgical History:  Procedure Laterality Date   CESAREAN SECTION     x2   TONSILLECTOMY     WISDOM TOOTH EXTRACTION     Social History[1] Family History  Problem Relation Age of Onset   Endometriosis Mother        hysterectomy age 33   Lymphoma Mother     Stroke Father    Basal cell carcinoma Father        skin   Colon polyps Maternal Grandmother    Diabetes Maternal Grandmother    Colon cancer Maternal Grandmother        70's   Hypertension Maternal Grandmother    Stroke Daughter        in utero   Learning disabilities Daughter    ADD / ADHD Daughter    Rectal cancer Other    Stomach cancer Other    Esophageal cancer Other    Allergies[2]  ROS Negative unless stated above    Objective:     BP 110/80 (BP Location: Right Arm, Patient Position: Sitting, Cuff Size: Normal)   Pulse 85   Temp 98.3 F (36.8 C) (Oral)   Ht 5' 1 (1.549 m)   Wt 118 lb 9.6 oz (53.8 kg)   LMP 10/01/2018   SpO2 98%   BMI 22.41 kg/m  BP Readings from Last 3 Encounters:  08/25/24 110/80  06/23/24 106/68  06/11/24 114/80   Wt Readings from Last 3 Encounters:  08/25/24 118 lb 9.6 oz (53.8 kg)  06/11/24 115 lb 9.6 oz (52.4 kg)  12/12/23 120 lb (54.4 kg)      Physical Exam Constitutional:      General: She is not in acute distress.    Appearance: Normal appearance.  HENT:  Head: Normocephalic and atraumatic.     Nose: Nose normal.     Mouth/Throat:     Mouth: Mucous membranes are moist.  Cardiovascular:     Rate and Rhythm: Normal rate and regular rhythm.     Heart sounds: Normal heart sounds. No murmur heard.    No gallop.  Pulmonary:     Effort: Pulmonary effort is normal. No respiratory distress.     Breath sounds: Normal breath sounds. No wheezing, rhonchi or rales.  Abdominal:     General: Abdomen is flat. Bowel sounds are normal.     Palpations: Abdomen is soft.     Tenderness: There is no abdominal tenderness. There is no right CVA tenderness, left CVA tenderness, guarding or rebound.  Skin:    General: Skin is warm and dry.  Neurological:     Mental Status: She is alert and oriented to person, place, and time.        06/11/2024    8:17 AM 12/12/2023   11:24 AM 12/25/2022    2:11 PM  Depression screen PHQ 2/9   Decreased Interest 0 0 0  Down, Depressed, Hopeless 0 0 0  PHQ - 2 Score 0 0 0      06/11/2024    8:17 AM 12/12/2023   11:24 AM 12/25/2022    2:11 PM 10/07/2018   11:24 AM  GAD 7 : Generalized Anxiety Score  Nervous, Anxious, on Edge 0 0 0 2  Control/stop worrying 0 0 0 1  Worry too much - different things 0 0 0 1  Trouble relaxing 0 0 0 3  Restless 0 0 0 2  Easily annoyed or irritable 0 0 0 1  Afraid - awful might happen 0 0 0 0  Total GAD 7 Score 0 0 0 10  Anxiety Difficulty Not difficult at all Not difficult at all Not difficult at all Somewhat difficult     Results for orders placed or performed in visit on 08/25/24  POC Urinalysis Dipstick  Result Value Ref Range   Color, UA amber    Clarity, UA Clear    Glucose, UA Negative Negative   Bilirubin, UA neg    Ketones, UA neg    Spec Grav, UA <=1.005 (A) 1.010 - 1.025   Blood, UA pos (A)    pH, UA 5.5 5.0 - 8.0   Protein, UA Negative Negative   Urobilinogen, UA 1.0 0.2 or 1.0 E.U./dL   Nitrite, UA pos    Leukocytes, UA Large (3+) (A) Negative   Appearance     Odor        Assessment & Plan:   Suspected UTI -     Urine Culture -     Nitrofurantoin  Monohyd Macro; Take 1 capsule (100 mg total) by mouth 2 (two) times daily for 7 days.  Dispense: 14 capsule; Refill: 0  Dysuria -     POCT urinalysis dipstick -     Urine Culture  Hematuria, unspecified type -     POCT urinalysis dipstick -     Urine Culture  Pt with acute urinary symptoms.  POC UA with 3+ leuks, RBCs, amber-colored.  Use of Pyridium prior to appointment may be contributing to change in color of urine.  Obtain UCX.  Start Macrobid .  Further recommendations based on results if needed.  Discussed avoiding taking leftover antibiotics.  Advised to follow-up with PCP for further evaluation of recurrent symptoms.  Return if symptoms worsen or fail to improve.  Clotilda JONELLE Single, MD    [1]  Social History Tobacco Use   Smoking status: Never    Smokeless tobacco: Never  Vaping Use   Vaping status: Never Used  Substance Use Topics   Alcohol use: Not Currently    Comment: 2 glasses of wine per month   Drug use: No  [2] No Known Allergies

## 2024-08-25 NOTE — Telephone Encounter (Signed)
 Noted- patient has an appointment today at [facility].

## 2024-08-25 NOTE — Telephone Encounter (Signed)
 No appointments available at PCP office today Patient wanted to be seen today if possible, if needed She wondered about dropping off a urine, she would like to see her provider Dr Jodie due to a history of urinary issues. Appointment made for today 08/25/2024 at 3pm at [facility] with Dr Clotilda Single  FYI Only or Action Required?: Action required by provider: request for appointment, clinical question for provider, update on patient condition, and patient wanted provider advice.  Patient was last seen in primary care on 06/11/2024 by Jodie Lavern CROME, MD.  Called Nurse Triage reporting Urinary Frequency.  Symptoms began today.  Interventions attempted: Prescription medications: pyridium and leftover cefdinir from last month and Rest, hydration, or home remedies.  Symptoms are: gradually worsening.  Triage Disposition: See Physician Within 24 Hours  Patient/caregiver understands and will follow disposition?: Yes                Copied from CRM #[id number]. Topic: Clinical - Red Word Triage >> Aug 25, 2024  8:34 AM Mia F wrote: Red Word that prompted transfer to Nurse Triage: Possible UTI. She was scheduled to be seen but want to cancel the appt and just drop off a urine sample. Pt says she is having a lot of pain, blood in her urine, frequency and urgency. She says it just suddenly came on. Started today. >> Aug 25, 2024 10:26 AM Gustabo D wrote: Pt says she was disconnected from nurse and is calling back to speak with nurse  Reason for Disposition  Urinating more frequently than usual (i.e., frequency) OR new-onset of the feeling of an urgent need to urinate (i.e., urgency)  Answer Assessment - Initial Assessment Questions Patient states she woke up this morning with UTI symptoms--blood in urine, urgency, frequency, burning with urination  States this is probably the 3rd one in the past four months-- Patient took some pyridium Patient has Cefdinir leftover from Urgent  Care last month when she was seen--she started this and was concerned about a urine culture not being accurate since she started this Patient was going to ask about estrogen cream at her PCP appointment  Patient denies fever, back pain, side pain  Patient appt made for today at [facility]--patient aware and wanted an appt in case she had to be seen in office (unless her PCP Dr Lavern Jodie advised something different) She states she would do televisit  Patient is advised to call us  back if anything changes or with any further questions/concerns. Patient is advised that if anything worsens to go to the Emergency Room. Patient verbalized understanding.  Protocols used: Urinary Symptoms-A-AH

## 2024-08-25 NOTE — Telephone Encounter (Signed)
 Patient already triaged for this issue in a separate nurse triage encounter.

## 2024-08-26 ENCOUNTER — Ambulatory Visit: Admitting: Family Medicine

## 2024-08-26 LAB — URINE CULTURE
MICRO NUMBER:: 17368024
Result:: NO GROWTH
SPECIMEN QUALITY:: ADEQUATE

## 2024-08-27 ENCOUNTER — Ambulatory Visit: Payer: Self-pay | Admitting: Family Medicine

## 2024-09-08 ENCOUNTER — Ambulatory Visit: Payer: Self-pay

## 2024-09-08 NOTE — Telephone Encounter (Signed)
 FYI Only or Action Required?: FYI only for provider: UC advised .  Patient was last seen in primary care on 08/25/2024 by Mercer Clotilda SAUNDERS, MD.  Called Nurse Triage reporting Dysuria.  Symptoms began today.  Interventions attempted: Rest, hydration, or home remedies.  Symptoms are: stable.  Triage Disposition: See Physician Within 24 Hours  Patient/caregiver understands and will follow disposition?:           Reason for Disposition  Age > 50 years  Answer Assessment - Initial Assessment Questions Patient reports symptoms started a little bit ago, urgency and frequency and painful urination. Seen 12/17 was taking antibiotics macrobid  for UTI . Today having urinary urgency and dysuria  and worried blood in urine will happen next. Worried needs to see urology. Has been to multiple locations this year for these symptoms. Has been not having any sex. Patient advised UC as office is closed today at 12 PM no openings and closed tomorrow patient agreeable and going to UC wants a call back to set up a follow up with PCP after UC visit.      1. SEVERITY: How bad is the pain?  (e.g., Scale 1-10; mild, moderate, or severe)     3/10  2. FREQUENCY: How many times have you had painful urination today?      Having urinary frequency going more than a few drops  3. PATTERN: Is pain present every time you urinate or just sometimes?      This morning every time  4. ONSET: When did the painful urination start?      This morning  5. FEVER: Do you have a fever? If Yes, ask: What is your temperature, how was it measured, and when did it start?     Denies  6. PAST UTI: Have you had a urine infection before? If Yes, ask: When was the last time? and What happened that time?      Yes had one this month, went to [facility] in November and ER 2 months ago negative culture  7. CAUSE: What do you think is causing the painful urination?  (e.g., UTI, scratch, Herpes sore)     Unknown  8.  OTHER SYMPTOMS: Do you have any other symptoms? (e.g., blood in urine, flank pain, genital sores, urgency, vaginal discharge)     Today so far only painful urination urinary urgency and frequency has not seen blood in past   Patient denies the following fever, back pain, flank pain, vomiting , blood in urine  Protocols used: Urination Pain - [facility]  Copied from CRM #[id number]. Topic: Clinical - Red Word Triage >> Sep 08, 2024 11:14 AM Larissa RAMAN wrote: [facility] that prompted transfer to Nurse Triage: frequent urination with pain

## 2024-09-14 ENCOUNTER — Ambulatory Visit
Admission: RE | Admit: 2024-09-14 | Discharge: 2024-09-14 | Disposition: A | Source: Ambulatory Visit | Attending: Family Medicine | Admitting: Family Medicine

## 2024-09-14 DIAGNOSIS — Z1231 Encounter for screening mammogram for malignant neoplasm of breast: Secondary | ICD-10-CM

## 2024-09-28 ENCOUNTER — Other Ambulatory Visit (HOSPITAL_BASED_OUTPATIENT_CLINIC_OR_DEPARTMENT_OTHER): Payer: Self-pay

## 2024-09-28 MED ORDER — ESTRADIOL 0.01 % VA CREA
1.0000 | TOPICAL_CREAM | VAGINAL | 5 refills | Status: AC
  Filled 2024-09-28: qty 42.5, 85d supply, fill #0

## 2024-09-28 MED ORDER — DIAZEPAM 5 MG PO TABS
ORAL_TABLET | ORAL | 2 refills | Status: AC
  Filled 2024-09-28: qty 30, 30d supply, fill #0

## 2024-09-28 MED ORDER — MELOXICAM 15 MG PO TABS
15.0000 mg | ORAL_TABLET | Freq: Every day | ORAL | 1 refills | Status: AC | PRN
  Filled 2024-09-28: qty 30, 30d supply, fill #0

## 2025-06-13 ENCOUNTER — Encounter: Admitting: Family Medicine

## 2025-06-15 ENCOUNTER — Encounter: Admitting: Family Medicine
